# Patient Record
Sex: Female | Born: 1970 | Race: Black or African American | Hispanic: No | Smoking: Current every day smoker
Health system: Southern US, Community
[De-identification: ages and names within clinical notes are randomized; demographics above are authoritative.]

## PROBLEM LIST (undated history)

## (undated) ENCOUNTER — Inpatient Hospital Stay (HOSPITAL_COMMUNITY): Payer: Self-pay

## (undated) ENCOUNTER — Ambulatory Visit (HOSPITAL_COMMUNITY): Admission: EM | Source: Home / Self Care

## (undated) DIAGNOSIS — G43909 Migraine, unspecified, not intractable, without status migrainosus: Secondary | ICD-10-CM

## (undated) DIAGNOSIS — E669 Obesity, unspecified: Secondary | ICD-10-CM

---

## 2006-07-20 ENCOUNTER — Emergency Department (HOSPITAL_COMMUNITY): Admission: EM | Admit: 2006-07-20 | Discharge: 2006-07-20 | Payer: Self-pay | Admitting: Emergency Medicine

## 2006-12-27 ENCOUNTER — Emergency Department (HOSPITAL_COMMUNITY): Admission: EM | Admit: 2006-12-27 | Discharge: 2006-12-27 | Payer: Self-pay | Admitting: Emergency Medicine

## 2007-01-19 ENCOUNTER — Encounter: Admission: RE | Admit: 2007-01-19 | Discharge: 2007-02-08 | Payer: Self-pay | Admitting: Orthopedic Surgery

## 2007-03-22 ENCOUNTER — Emergency Department (HOSPITAL_COMMUNITY): Admission: EM | Admit: 2007-03-22 | Discharge: 2007-03-22 | Payer: Self-pay | Admitting: Emergency Medicine

## 2008-06-25 ENCOUNTER — Inpatient Hospital Stay (HOSPITAL_COMMUNITY): Admission: AD | Admit: 2008-06-25 | Discharge: 2008-06-25 | Payer: Self-pay | Admitting: Obstetrics & Gynecology

## 2008-06-25 ENCOUNTER — Encounter: Payer: Self-pay | Admitting: Obstetrics & Gynecology

## 2008-07-17 ENCOUNTER — Inpatient Hospital Stay (HOSPITAL_COMMUNITY): Admission: AD | Admit: 2008-07-17 | Discharge: 2008-07-17 | Payer: Self-pay | Admitting: Obstetrics and Gynecology

## 2008-07-17 ENCOUNTER — Encounter (INDEPENDENT_AMBULATORY_CARE_PROVIDER_SITE_OTHER): Payer: Self-pay | Admitting: Obstetrics and Gynecology

## 2008-10-14 ENCOUNTER — Inpatient Hospital Stay (HOSPITAL_COMMUNITY): Admission: AD | Admit: 2008-10-14 | Discharge: 2008-10-14 | Payer: Self-pay | Admitting: Obstetrics and Gynecology

## 2009-05-19 ENCOUNTER — Emergency Department (HOSPITAL_COMMUNITY): Admission: EM | Admit: 2009-05-19 | Discharge: 2009-05-19 | Payer: Self-pay | Admitting: Emergency Medicine

## 2009-05-28 ENCOUNTER — Inpatient Hospital Stay (HOSPITAL_COMMUNITY): Admission: AD | Admit: 2009-05-28 | Discharge: 2009-05-28 | Payer: Self-pay | Admitting: Obstetrics & Gynecology

## 2009-06-11 ENCOUNTER — Inpatient Hospital Stay (HOSPITAL_COMMUNITY): Admission: AD | Admit: 2009-06-11 | Discharge: 2009-06-11 | Payer: Self-pay | Admitting: Obstetrics and Gynecology

## 2009-08-02 ENCOUNTER — Ambulatory Visit (HOSPITAL_COMMUNITY): Admission: RE | Admit: 2009-08-02 | Discharge: 2009-08-02 | Payer: Self-pay | Admitting: Obstetrics & Gynecology

## 2009-10-09 ENCOUNTER — Inpatient Hospital Stay (HOSPITAL_COMMUNITY): Admission: AD | Admit: 2009-10-09 | Discharge: 2009-11-01 | Payer: Self-pay | Admitting: Obstetrics & Gynecology

## 2009-10-28 ENCOUNTER — Encounter (INDEPENDENT_AMBULATORY_CARE_PROVIDER_SITE_OTHER): Payer: Self-pay | Admitting: Obstetrics & Gynecology

## 2009-11-13 ENCOUNTER — Ambulatory Visit: Payer: Self-pay | Admitting: Obstetrics and Gynecology

## 2009-11-13 ENCOUNTER — Inpatient Hospital Stay (HOSPITAL_COMMUNITY): Admission: AD | Admit: 2009-11-13 | Discharge: 2009-11-13 | Payer: Self-pay | Admitting: Obstetrics & Gynecology

## 2010-03-27 ENCOUNTER — Observation Stay (HOSPITAL_COMMUNITY)
Admission: AD | Admit: 2010-03-27 | Discharge: 2010-03-28 | Disposition: A | Discharge status: Home / Self Care | Payer: Medicaid Other | Source: Ambulatory Visit | Attending: Obstetrics & Gynecology | Admitting: Obstetrics & Gynecology

## 2010-03-27 DIAGNOSIS — R112 Nausea with vomiting, unspecified: Secondary | ICD-10-CM | POA: Insufficient documentation

## 2010-03-27 DIAGNOSIS — K5289 Other specified noninfective gastroenteritis and colitis: Principal | ICD-10-CM | POA: Insufficient documentation

## 2010-03-27 LAB — CBC
HCT: 35.5 % — ABNORMAL LOW (ref 36.0–46.0)
Hemoglobin: 11.6 g/dL — ABNORMAL LOW (ref 12.0–15.0)
MCHC: 32.7 g/dL (ref 30.0–36.0)
MCV: 89.2 fL (ref 78.0–100.0)
RBC: 3.98 MIL/uL (ref 3.87–5.11)
RDW: 14.3 % (ref 11.5–15.5)

## 2010-03-27 LAB — COMPREHENSIVE METABOLIC PANEL
ALT: 26 U/L (ref 0–35)
AST: 23 U/L (ref 0–37)
Alkaline Phosphatase: 110 U/L (ref 39–117)
Creatinine, Ser: 0.65 mg/dL (ref 0.4–1.2)
GFR calc Af Amer: >60 mL/min (ref >60)
GFR calc non Af Amer: >60 mL/min (ref >60)
Glucose, Bld: 107 mg/dL — ABNORMAL HIGH (ref 70–99)
Potassium: 3.5 mEq/L (ref 3.5–5.1)
Total Bilirubin: 1 mg/dL (ref 0.3–1.2)
Total Protein: 8.3 g/dL (ref 6.0–8.3)

## 2010-03-28 LAB — CBC
MCH: 28.6 pg (ref 26.0–34.0)
MCHC: 32.4 g/dL (ref 30.0–36.0)
MCV: 88.3 fL (ref 78.0–100.0)
RDW: 14.5 % (ref 11.5–15.5)
WBC: 10.1 10*3/uL (ref 4.0–10.5)

## 2010-03-28 LAB — BASIC METABOLIC PANEL
Calcium: 8.6 mg/dL (ref 8.4–10.5)
Chloride: 105 mEq/L (ref 96–112)
GFR calc Af Amer: >60 mL/min (ref >60)
Potassium: 3.5 mEq/L (ref 3.5–5.1)
Sodium: 137 mEq/L (ref 135–145)

## 2010-03-28 LAB — AMYLASE: Amylase: 51 U/L (ref 0–105)

## 2010-04-23 NOTE — Discharge Summary (Addendum)
  NAME:  Madeline Silva, Madeline Silva.:  [id number]  MEDICAL RECORD NO.:  [id number]           PATIENT TYPE:  I  LOCATION:  9317                          FACILITY:  [facility]  PHYSICIAN:  Miguel Aschoff, M.D.       DATE OF BIRTH:  June 02, 1970  DATE OF ADMISSION:  03/27/2010 DATE OF DISCHARGE:  03/28/2010                              DISCHARGE SUMMARY   ADMISSION DIAGNOSES: 1. Nausea and vomiting. 2. Dehydration.  FINAL DIAGNOSES: 1. Nausea and vomiting. 2. Dehydration.  BRIEF HISTORY:  The patient is a 40 year old black female, who underwent elective termination of pregnancy on March 24, 2010 at the [facility].  The patient developed the onset of severe nausea and vomiting associated with low abdominal pain and vaginal bleeding, and was assessed on the day of admission at [facility], and felt to have retained products of conception.  A repeat suction and curettage procedure was carried out without difficulty, and the patient was observed in the recovery room, but continued to have persistent nausea and vomiting, and was able to tolerate even p.o. fluids.  Because of this, she was transferred to the [facility] to undergo IV fluid hydration and antiemetic therapy.  HOSPITAL COURSE:  Admission studies were obtained.  This revealed admission hemoglobin of 11.6, hematocrit of 35.5, white count was 12,700.  Basic metabolic profile revealed sodium 133, glucose 107, BUN was 4.  The remainder of the values were within normal limits.  The patient was started on IV fluids and antidiabetic treatment and responded readily and after an overnight observation, she was tolerating p.o. fluids well with resolution of both her pain, nausea and vomiting. By the morning of March 28, 2010, she was in satisfactory condition and felt to be stable enough to be discharged home.  Medications for home included: 1. Doxycycline 100 mg p.o. b.i.d. 2. Reglan 10 mg p.o.  q.6 h. p.r.n. nausea.  She was instructed to place nothing in the vagina for 2 weeks, call for any additional problems such as fever, pain, heavy bleeding, or persistent nausea and vomiting.  She will be seen back at the [facility] in 4 weeks for followup examination.  Her discharge hemoglobin was 9.0, white count was 10.1.  She was sent home in satisfactory condition.     Miguel Aschoff, M.D.     AR/MEDQ  D:  04/21/2010  T:  04/22/2010  Job:  [id number]  Electronically Signed by Miguel Aschoff M.D. on 04/23/2010 09:33:52 AM

## 2010-05-08 LAB — CBC
HCT: 34.5 % — ABNORMAL LOW (ref 36.0–46.0)
HCT: 31.8 % — ABNORMAL LOW (ref 36.0–46.0)
Hemoglobin: 11.4 g/dL — ABNORMAL LOW (ref 12.0–15.0)
Hemoglobin: 10.8 g/dL — ABNORMAL LOW (ref 12.0–15.0)
Hemoglobin: 9.9 g/dL — ABNORMAL LOW (ref 12.0–15.0)
MCH: 30.9 pg (ref 26.0–34.0)
MCH: 32 pg (ref 26.0–34.0)
MCHC: 34 g/dL (ref 30.0–36.0)
MCV: 93 fL (ref 78.0–100.0)
MCV: 93.9 fL (ref 78.0–100.0)
Platelets: 287 10*3/uL (ref 150–400)
Platelets: 163 10*3/uL (ref 150–400)
Platelets: 181 10*3/uL (ref 150–400)
RBC: 3.71 MIL/uL — ABNORMAL LOW (ref 3.87–5.11)
RBC: 3.1 MIL/uL — ABNORMAL LOW (ref 3.87–5.11)
RBC: 3.41 MIL/uL — ABNORMAL LOW (ref 3.87–5.11)
RDW: 13.9 % (ref 11.5–15.5)
WBC: 7 10*3/uL (ref 4.0–10.5)

## 2010-05-08 LAB — COMPREHENSIVE METABOLIC PANEL
ALT: 23 U/L (ref 0–35)
Albumin: 3.5 g/dL (ref 3.5–5.2)
BUN: 7 mg/dL (ref 6–23)
CO2: 29 mEq/L (ref 19–32)
Calcium: 9.3 mg/dL (ref 8.4–10.5)
Chloride: 108 mEq/L (ref 96–112)
Creatinine, Ser: 0.96 mg/dL (ref 0.4–1.2)
Glucose, Bld: 101 mg/dL — ABNORMAL HIGH (ref 70–99)
Sodium: 143 mEq/L (ref 135–145)
Total Bilirubin: 0.4 mg/dL (ref 0.3–1.2)

## 2010-05-09 LAB — CBC
HCT: 28.4 % — ABNORMAL LOW (ref 36.0–46.0)
Hemoglobin: 9.6 g/dL — ABNORMAL LOW (ref 12.0–15.0)
MCH: 31.5 pg (ref 26.0–34.0)
MCH: 31.7 pg (ref 26.0–34.0)
MCHC: 33.3 g/dL (ref 30.0–36.0)
MCV: 93.6 fL (ref 78.0–100.0)
Platelets: 171 10*3/uL (ref 150–400)
Platelets: 179 10*3/uL (ref 150–400)
RBC: 3.03 MIL/uL — ABNORMAL LOW (ref 3.87–5.11)
RBC: 3.33 MIL/uL — ABNORMAL LOW (ref 3.87–5.11)
RDW: 14.6 % (ref 11.5–15.5)
RDW: 14.7 % (ref 11.5–15.5)
WBC: 12 10*3/uL — ABNORMAL HIGH (ref 4.0–10.5)
WBC: 12.8 10*3/uL — ABNORMAL HIGH (ref 4.0–10.5)
WBC: 8.7 10*3/uL (ref 4.0–10.5)

## 2010-05-09 LAB — STREP B DNA PROBE: Strep Group B Ag: NEGATIVE

## 2010-05-14 LAB — URINALYSIS, ROUTINE W REFLEX MICROSCOPIC
Glucose, UA: NEGATIVE mg/dL
Leukocytes, UA: NEGATIVE
Nitrite: NEGATIVE
Specific Gravity, Urine: >1.03 — ABNORMAL HIGH (ref 1.005–1.030)
pH: 5.5 (ref 5.0–8.0)

## 2010-05-14 LAB — URINE MICROSCOPIC-ADD ON

## 2010-05-14 LAB — WET PREP, GENITAL: Yeast Wet Prep HPF POC: NONE SEEN

## 2010-05-19 LAB — HCG, QUANTITATIVE, PREGNANCY: hCG, Beta Chain, Quant, S: 44703 m[IU]/mL — ABNORMAL HIGH (ref <5)

## 2010-05-19 LAB — URINALYSIS, ROUTINE W REFLEX MICROSCOPIC
Glucose, UA: NEGATIVE mg/dL
Ketones, ur: >80 mg/dL — AB
Nitrite: NEGATIVE
Specific Gravity, Urine: 1.02 (ref 1.005–1.030)
pH: 7.5 (ref 5.0–8.0)

## 2010-05-19 LAB — URINE MICROSCOPIC-ADD ON

## 2010-05-29 IMAGING — US US OB COMP LESS 14 WK
1 series · 14 of 28 positions shown · non-contrast
Comparison: none

OBSTETRICAL ULTRASOUND:
 This ultrasound exam was performed in the [HOSPITAL] Ultrasound Department.  The OB US report was generated in the AS system, and faxed to the ordering physician.  This report is also available in [HOSPITAL]?s AccessANYware and in [REDACTED] PACS.

[Series 1: us ob comp less 14 wks · 0.30mm/px · 14 of 45 slices shown]
[im 2/45]
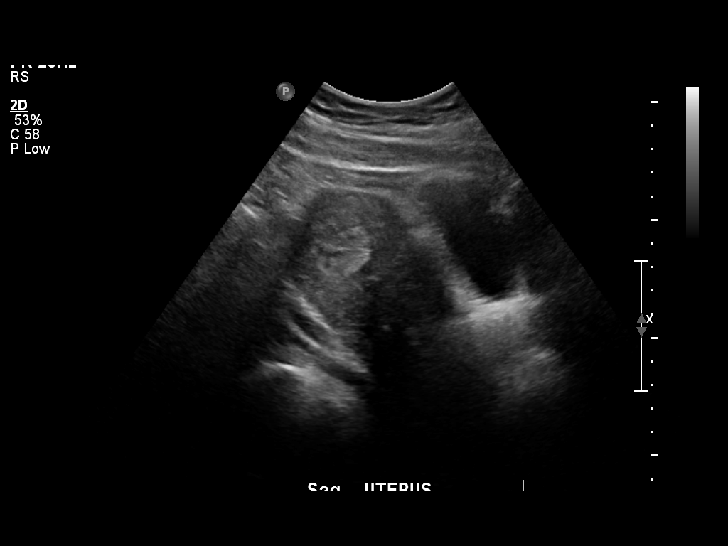
[im 5/45]
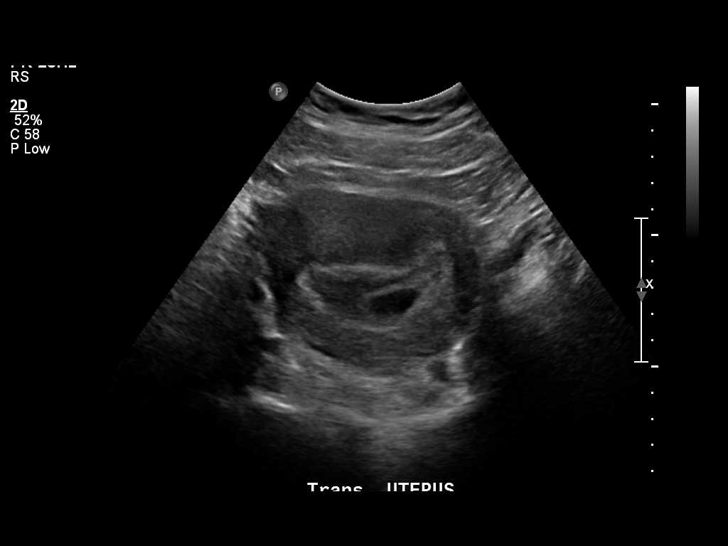
[im 9/45]
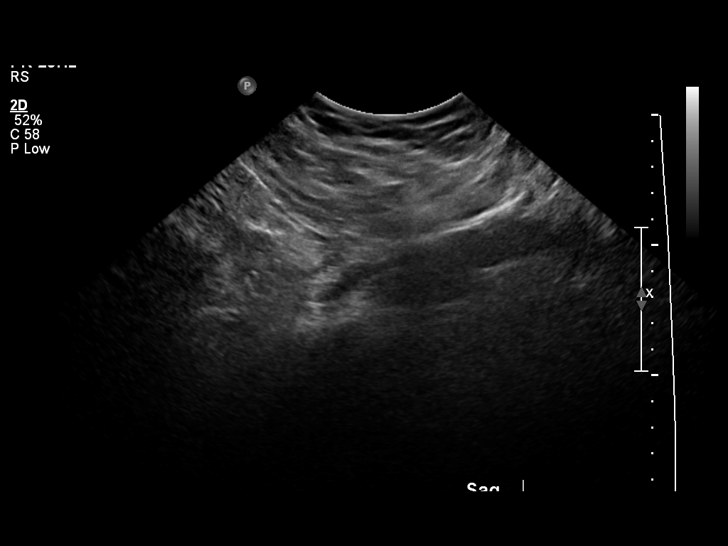
[im 12/45]
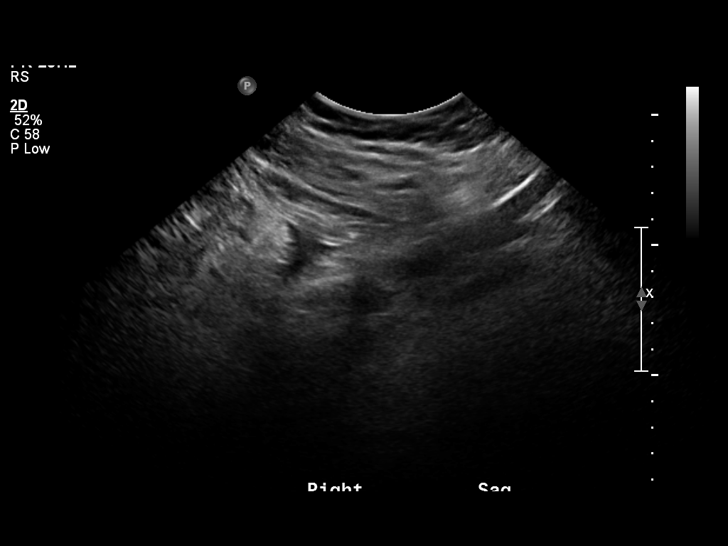
[im 15/45]
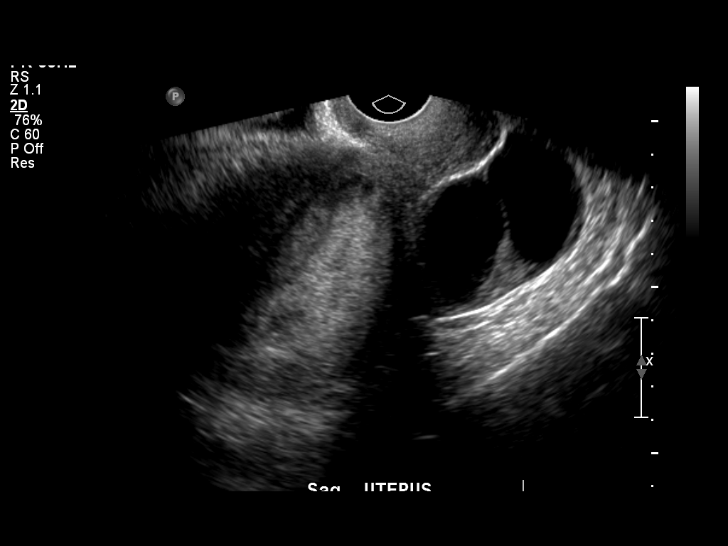
[im 18/45]
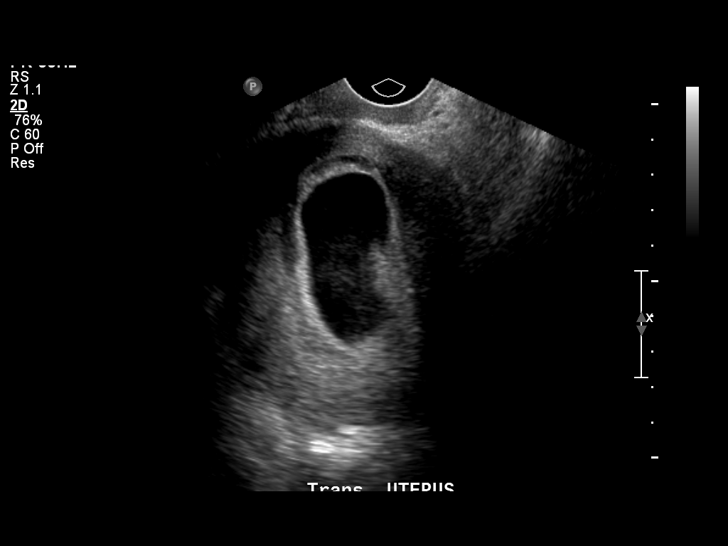
[im 22/45]
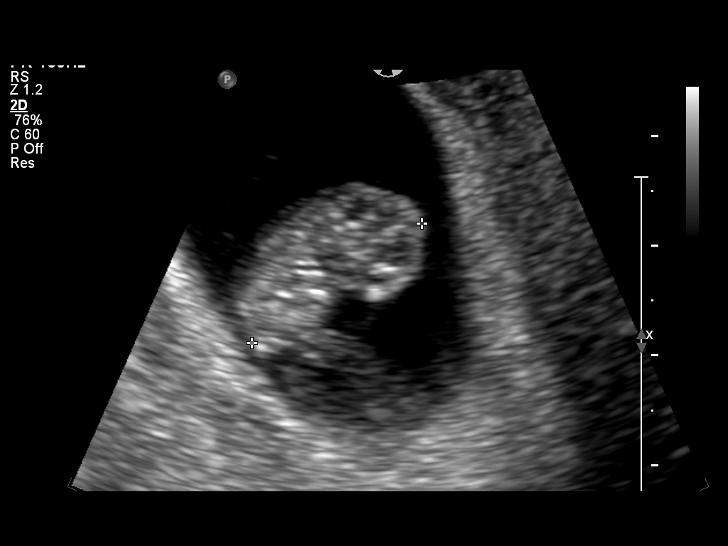
[im 25/45]
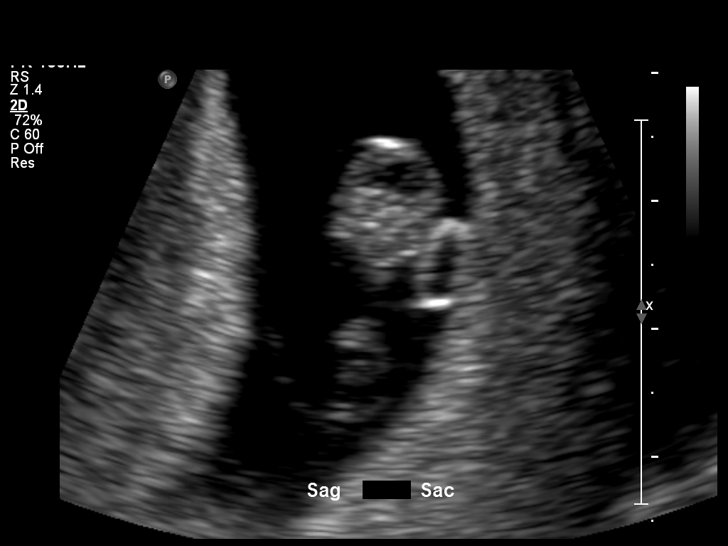
[im 28/45]
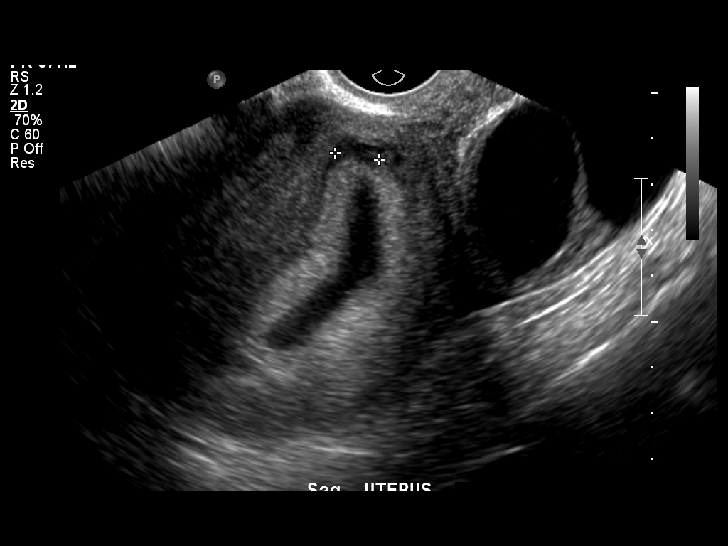
[im 31/45]
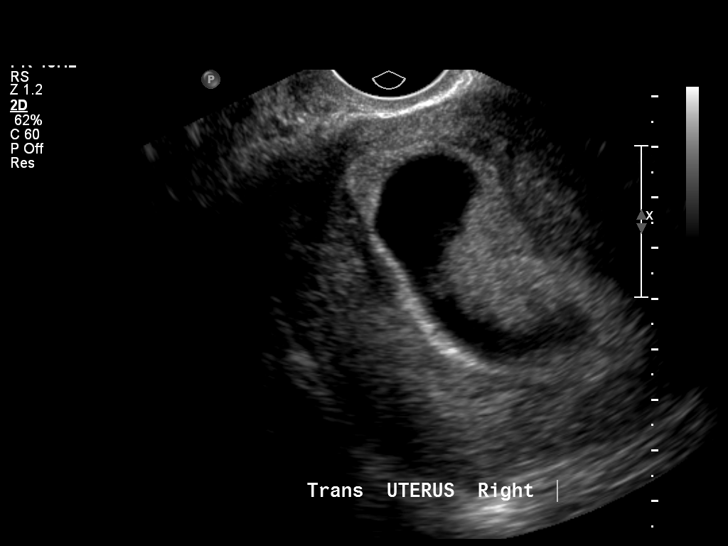
[im 35/45]
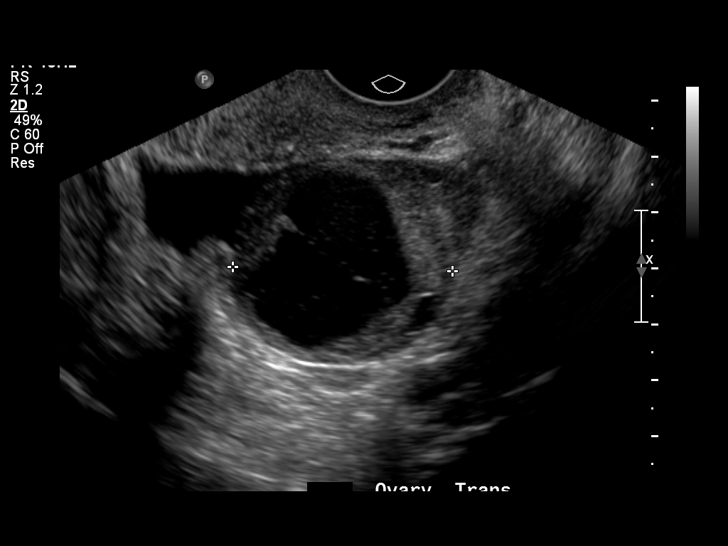
[im 38/45]
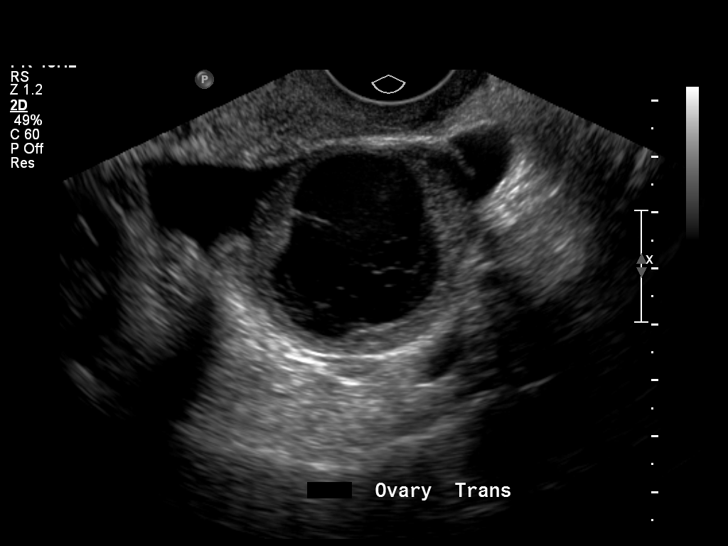
[im 41/45]
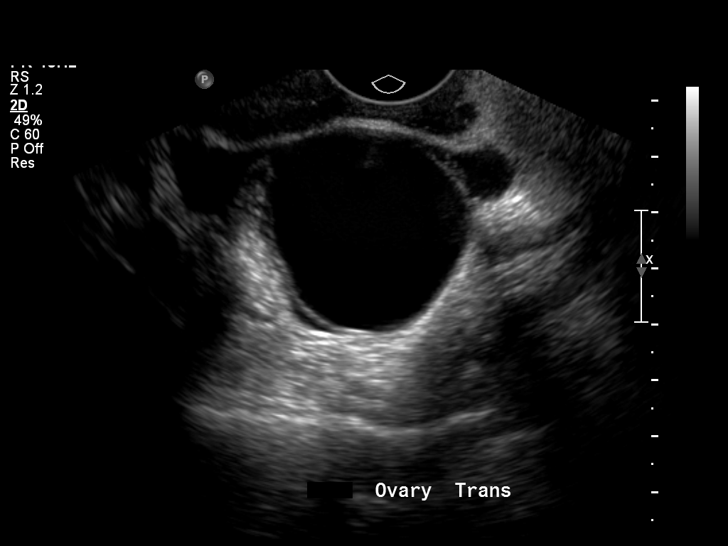
[im 45/45]
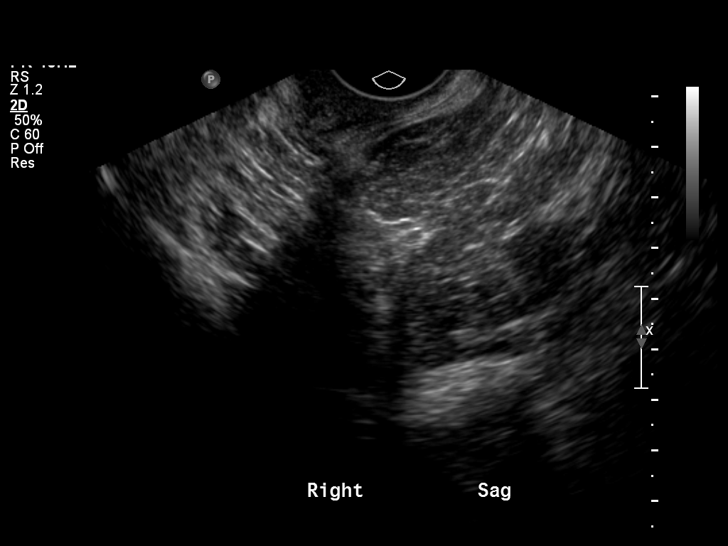

[14 of 28 positions shown; findings below may reference images not displayed]

IMPRESSION: See AS Obstetric US report.

## 2010-06-03 LAB — URINALYSIS, ROUTINE W REFLEX MICROSCOPIC
Ketones, ur: 15 mg/dL — AB
Specific Gravity, Urine: >1.03 — ABNORMAL HIGH (ref 1.005–1.030)
pH: 6 (ref 5.0–8.0)

## 2010-06-03 LAB — CBC
HCT: 36.8 % (ref 36.0–46.0)
HCT: 38.9 % (ref 36.0–46.0)
Hemoglobin: 12.5 g/dL (ref 12.0–15.0)
Hemoglobin: 13 g/dL (ref 12.0–15.0)
MCHC: 33.3 g/dL (ref 30.0–36.0)
MCV: 92 fL (ref 78.0–100.0)
MCV: 91.8 fL (ref 78.0–100.0)
RDW: 14.6 % (ref 11.5–15.5)
RDW: 14.6 % (ref 11.5–15.5)
WBC: 9.8 10*3/uL (ref 4.0–10.5)

## 2010-06-03 LAB — WET PREP, GENITAL: Yeast Wet Prep HPF POC: NONE SEEN

## 2010-06-03 LAB — GC/CHLAMYDIA PROBE AMP, GENITAL
Chlamydia, DNA Probe: NEGATIVE
GC Probe Amp, Genital: NEGATIVE

## 2010-06-03 LAB — URINE MICROSCOPIC-ADD ON

## 2010-10-12 ENCOUNTER — Emergency Department (HOSPITAL_COMMUNITY): Payer: Self-pay

## 2010-10-12 ENCOUNTER — Emergency Department (HOSPITAL_COMMUNITY)
Admission: EM | Admit: 2010-10-12 | Discharge: 2010-10-12 | Disposition: A | Discharge status: Home / Self Care | Payer: Self-pay | Attending: Emergency Medicine | Admitting: Emergency Medicine

## 2010-10-12 DIAGNOSIS — M7989 Other specified soft tissue disorders: Secondary | ICD-10-CM | POA: Insufficient documentation

## 2010-10-12 DIAGNOSIS — W1809XA Striking against other object with subsequent fall, initial encounter: Secondary | ICD-10-CM | POA: Insufficient documentation

## 2010-10-12 DIAGNOSIS — S6990XA Unspecified injury of unspecified wrist, hand and finger(s), initial encounter: Secondary | ICD-10-CM | POA: Insufficient documentation

## 2010-10-12 DIAGNOSIS — S6980XA Other specified injuries of unspecified wrist, hand and finger(s), initial encounter: Secondary | ICD-10-CM | POA: Insufficient documentation

## 2010-10-23 ENCOUNTER — Emergency Department (HOSPITAL_COMMUNITY): Payer: Self-pay

## 2010-10-23 ENCOUNTER — Emergency Department (HOSPITAL_COMMUNITY)
Admission: EM | Admit: 2010-10-23 | Discharge: 2010-10-24 | Disposition: A | Discharge status: Home / Self Care | Payer: Self-pay | Attending: Emergency Medicine | Admitting: Emergency Medicine

## 2010-10-23 DIAGNOSIS — J189 Pneumonia, unspecified organism: Secondary | ICD-10-CM | POA: Insufficient documentation

## 2010-10-23 DIAGNOSIS — R0602 Shortness of breath: Secondary | ICD-10-CM | POA: Insufficient documentation

## 2011-06-15 ENCOUNTER — Encounter (HOSPITAL_COMMUNITY): Payer: Self-pay | Admitting: *Deleted

## 2011-06-15 ENCOUNTER — Inpatient Hospital Stay (HOSPITAL_COMMUNITY)
Admission: AD | Admit: 2011-06-15 | Discharge: 2011-06-15 | Disposition: A | Discharge status: Home / Self Care | Payer: Self-pay | Source: Ambulatory Visit | Attending: Obstetrics and Gynecology | Admitting: Obstetrics and Gynecology

## 2011-06-15 DIAGNOSIS — A5901 Trichomonal vulvovaginitis: Secondary | ICD-10-CM | POA: Insufficient documentation

## 2011-06-15 DIAGNOSIS — A599 Trichomoniasis, unspecified: Secondary | ICD-10-CM

## 2011-06-15 DIAGNOSIS — L293 Anogenital pruritus, unspecified: Secondary | ICD-10-CM | POA: Insufficient documentation

## 2011-06-15 LAB — URINALYSIS, ROUTINE W REFLEX MICROSCOPIC
Glucose, UA: NEGATIVE mg/dL
Ketones, ur: NEGATIVE mg/dL
Nitrite: NEGATIVE
Protein, ur: NEGATIVE mg/dL

## 2011-06-15 LAB — URINE MICROSCOPIC-ADD ON

## 2011-06-15 LAB — WET PREP, GENITAL

## 2011-06-15 MED ORDER — METRONIDAZOLE 500 MG PO TABS: 500.0000 mg | ORAL_TABLET | Freq: Two times a day (BID) | ORAL | Status: AC

## 2011-06-15 NOTE — Discharge Instructions (Signed)
Trichomoniasis Trichomoniasis is an infection, caused by the Trichomonas organism, that affects both women and men. In women, the outer female genitalia and the vagina are affected. In men, the penis is mainly affected, but the prostate and other reproductive organs can also be involved. Trichomoniasis is a sexually transmitted disease (STD) and is most often passed to another person through sexual contact. The majority of people who get trichomoniasis do so from a sexual encounter and are also at risk for other STDs. CAUSES   Sexual intercourse with an infected partner.   It can be present in swimming pools or hot tubs.  SYMPTOMS   Abnormal gray-green frothy vaginal discharge in women.   Vaginal itching and irritation in women.   Itching and irritation of the area outside the vagina in women.   Penile discharge with or without pain in males.   Inflammation of the urethra (urethritis), causing painful urination.   Bleeding after sexual intercourse.  RELATED COMPLICATIONS  Pelvic inflammatory disease.   Infection of the uterus (endometritis).   Infertility.   Tubal (ectopic) pregnancy.   It can be associated with other STDs, including gonorrhea and chlamydia, hepatitis B, and HIV.  COMPLICATIONS DURING PREGNANCY  Early (premature) delivery.   Premature rupture of the membranes (PROM).   Low birth weight.  DIAGNOSIS   Visualization of Trichomonas under the microscope from the vagina discharge.   Ph of the vagina greater than 4.5, tested with a test tape.   Trich Rapid Test.   Culture of the organism, but this is not usually needed.   It may be found on a Pap test.   Having a "strawberry cervix,"which means the cervix looks very red like a strawberry.  TREATMENT   You may be given medication to fight the infection. Inform your caregiver if you could be or are pregnant. Some medications used to treat the infection should not be taken during pregnancy.    Over-the-counter medications or creams to decrease itching or irritation may be recommended.   Your sexual partner will need to be treated if infected.  HOME CARE INSTRUCTIONS   Take all medication prescribed by your caregiver.   Take over-the-counter medication for itching or irritation as directed by your caregiver.   Do not have sexual intercourse while you have the infection.   Do not douche or wear tampons.   Discuss your infection with your partner, as your partner may have acquired the infection from you. Or, your partner may have been the person who transmitted the infection to you.   Have your sex partner examined and treated if necessary.   Practice safe, informed, and protected sex.   See your caregiver for other STD testing.  SEEK MEDICAL CARE IF:   You still have symptoms after you finish the medication.   You have an oral temperature above 102 F (38.9 C).   You develop belly (abdominal) pain.   You have pain when you urinate.   You have bleeding after sexual intercourse.   You develop a rash.   The medication makes you sick or makes you throw up (vomit).  Document Released: 08/05/2000 Document Revised: 01/29/2011 Document Reviewed: 08/31/2008 [facility] Patient Information 2012 [place], [place].  Prenatal Care [facility]    [facility]  [phone]     Phone: [phone]          [facility]  [facility]  @[place]     Phone: [phone]  Phone: [phone]         [facility]     Phone: [phone]  Phone: [phone]           [facility] @ [place]                hone: [phone]  Phone: [phone]         [facility] [address]      Phone: [phone]  Phone: [phone]         [facility] Dept[facility]                 Phone: [phone]  [facility]   Phone:[phone]    [facility] [address] [place])          Phone: [phone] [facility]   Phone: [phone]  These providers, as well as [facility], also provide birth control

## 2011-06-15 NOTE — MAU Provider Note (Signed)
Meha [id number] y.o. No chief complaint on file.    None     SUBJECTIVE  HPI:  Madeline Silva is a 41 y.o. year old [id number] who presents to MAU reporting vulvovaginal itching and discharge x several days. Used Monistat externally without relief. Hx yeast x 1. No contraception and only recent sexual activity was 2 wks ago. LMP 1 wk ago.   History reviewed. No pertinent past medical history. No past surgical history on file. History   Social History  . Marital Status: Single    Spouse Name: N/A    Number of Children: N/A  . Years of Education: N/A   Occupational History  . Not on file.   Social History Main Topics  . Smoking status: Current Everyday Smoker -- 0.2 packs/day    Types: Cigarettes  . Smokeless tobacco: Never Used  . Alcohol Use: Yes     weekends  . Drug Use: No  . Sexually Active:    Other Topics Concern  . Not on file   Social History Narrative  . No narrative on file   No current facility-administered medications on file prior to encounter.   No current outpatient prescriptions on file prior to encounter.   No Known Allergies  ROS: Pertinent items in HPI  OBJECTIVE Height 5\' 4"  (1.626 m), weight 125.193 kg (276 lb). GENERAL: Well-developed, well-nourished female in no acute distress.  ABDOMEN: Soft, nontender EXTREMITIES: Nontender, no edema SPECULUM EXAM: NEFG, sl red,  Moderate grey white discharge, no blood noted, cervix nulliparous, sl friable BIMANUAL: cervix w/o CMT; uterus NSSP; no adnexal tenderness or masses   LAB RESULTS Results for orders placed during the hospital encounter of 06/15/11 (from the past 24 hour(s))  POCT PREGNANCY, URINE     Status: Normal   Collection Time   06/15/11  8:34 AM      Component Value Range   Preg Test, Ur NEGATIVE  NEGATIVE   URINALYSIS, ROUTINE W REFLEX MICROSCOPIC     Status: Abnormal   Collection Time   06/15/11  8:37 AM      Component Value Range   Color, Urine YELLOW  YELLOW    APPearance CLEAR   CLEAR    Specific Gravity, Urine >1.030 (*) 1.005 - 1.030    pH 6.0  5.0 - 8.0    Glucose, UA NEGATIVE  NEGATIVE (mg/dL)   Hgb urine dipstick TRACE (*) NEGATIVE    Bilirubin Urine NEGATIVE  NEGATIVE    Ketones, ur NEGATIVE  NEGATIVE (mg/dL)   Protein, ur NEGATIVE  NEGATIVE (mg/dL)   Urobilinogen, UA 0.2  0.0 - 1.0 (mg/dL)   Nitrite NEGATIVE  NEGATIVE    Leukocytes, UA SMALL (*) NEGATIVE   URINE MICROSCOPIC-ADD ON     Status: Abnormal   Collection Time   06/15/11  8:37 AM      Component Value Range   Squamous Epithelial / LPF RARE  RARE    WBC, UA 3-6  <3 (WBC/hpf)   RBC / HPF 0-2  <3 (RBC/hpf)   Bacteria, UA FEW (*) RARE    Urine-Other MUCOUS PRESENT    WET PREP, GENITAL     Status: Abnormal   Collection Time   06/15/11  8:45 AM      Component Value Range   Yeast Wet Prep HPF POC NONE SEEN  NONE SEEN    Trich, Wet Prep MODERATE (*) NONE SEEN    Clue Cells Wet Prep HPF POC FEW (*) NONE SEEN    WBC,  Wet Prep HPF POC FEW (*) NONE SEEN    GC/CT sent    ASSESSMENT Trichomonas vulvovaginitis  PLAN  Rx Flagyl Information on trich, STIs, safe sex    Madeline Silva 06/15/2011 8:43 AM

## 2011-06-15 NOTE — MAU Provider Note (Signed)
Agree with above note.  Kajal Scalici 06/15/2011 9:32 AM   

## 2011-06-16 LAB — GC/CHLAMYDIA PROBE AMP, GENITAL
Chlamydia, DNA Probe: NEGATIVE
GC Probe Amp, Genital: NEGATIVE

## 2011-09-28 ENCOUNTER — Emergency Department (HOSPITAL_COMMUNITY)
Admission: EM | Admit: 2011-09-28 | Discharge: 2011-09-28 | Discharge status: Against Medical Advice | Payer: Medicaid Other | Attending: Emergency Medicine | Admitting: Emergency Medicine

## 2011-09-28 ENCOUNTER — Encounter (HOSPITAL_COMMUNITY): Payer: Self-pay | Admitting: *Deleted

## 2011-09-28 DIAGNOSIS — R51 Headache: Secondary | ICD-10-CM | POA: Insufficient documentation

## 2011-09-28 DIAGNOSIS — R110 Nausea: Secondary | ICD-10-CM | POA: Insufficient documentation

## 2011-09-28 LAB — BASIC METABOLIC PANEL
BUN: 9 mg/dL (ref 6–23)
CO2: 23 mEq/L (ref 19–32)
Chloride: 104 mEq/L (ref 96–112)
Glucose, Bld: 93 mg/dL (ref 70–99)
Potassium: 3.9 mEq/L (ref 3.5–5.1)

## 2011-09-28 LAB — CBC WITH DIFFERENTIAL/PLATELET
Eosinophils Absolute: 0.1 10*3/uL (ref 0.0–0.7)
Hemoglobin: 13.6 g/dL (ref 12.0–15.0)
Lymphs Abs: 2 10*3/uL (ref 0.7–4.0)
MCH: 30.4 pg (ref 26.0–34.0)
Monocytes Relative: 7 % (ref 3–12)
Neutrophils Relative %: 64 % (ref 43–77)
RBC: 4.48 MIL/uL (ref 3.87–5.11)

## 2011-09-28 NOTE — ED Notes (Signed)
Pt called in waiting room but no answer 

## 2011-09-28 NOTE — ED Provider Notes (Signed)
Pt not in room for evaluation.   Fayrene Helper, PA-C 09/28/11 1452

## 2011-09-28 NOTE — ED Notes (Signed)
Phlebotomy reports to RN that pt mentioned while her blood was being drawn that she wanted her blood tested to see how far along she was in pregnancy because she believes she is pregnant. Urine preg added to orders.

## 2011-09-28 NOTE — ED Notes (Signed)
Pt reports nausea x2 weeks. When questioned about vomiting, she reports she has "spit up" approx 4x. Only nausea in last 24 hours. C/o HA, feels like it is developing into a migraine. Hx of same. +Photophobia. Denies vision changes.

## 2011-09-30 ENCOUNTER — Inpatient Hospital Stay (HOSPITAL_COMMUNITY): Payer: Medicaid Other

## 2011-09-30 ENCOUNTER — Inpatient Hospital Stay (HOSPITAL_COMMUNITY)
Admission: AD | Admit: 2011-09-30 | Discharge: 2011-09-30 | Disposition: A | Discharge status: Home / Self Care | Payer: Medicaid Other | Source: Ambulatory Visit | Attending: Obstetrics and Gynecology | Admitting: Obstetrics and Gynecology

## 2011-09-30 ENCOUNTER — Encounter (HOSPITAL_COMMUNITY): Payer: Self-pay | Admitting: *Deleted

## 2011-09-30 DIAGNOSIS — O219 Vomiting of pregnancy, unspecified: Secondary | ICD-10-CM

## 2011-09-30 DIAGNOSIS — O99891 Other specified diseases and conditions complicating pregnancy: Secondary | ICD-10-CM | POA: Insufficient documentation

## 2011-09-30 DIAGNOSIS — O26899 Other specified pregnancy related conditions, unspecified trimester: Secondary | ICD-10-CM

## 2011-09-30 DIAGNOSIS — R109 Unspecified abdominal pain: Secondary | ICD-10-CM | POA: Insufficient documentation

## 2011-09-30 LAB — WET PREP, GENITAL: Yeast Wet Prep HPF POC: NONE SEEN

## 2011-09-30 LAB — CBC
HCT: 37.2 % (ref 36.0–46.0)
Hemoglobin: 12.2 g/dL (ref 12.0–15.0)
WBC: 6.3 10*3/uL (ref 4.0–10.5)

## 2011-09-30 LAB — URINE MICROSCOPIC-ADD ON

## 2011-09-30 LAB — URINALYSIS, ROUTINE W REFLEX MICROSCOPIC
Glucose, UA: NEGATIVE mg/dL
Specific Gravity, Urine: >1.03 — ABNORMAL HIGH (ref 1.005–1.030)
pH: 6 (ref 5.0–8.0)

## 2011-09-30 MED ORDER — PROMETHAZINE HCL 25 MG RE SUPP: 25.0000 mg | Freq: Four times a day (QID) | RECTAL | Status: DC | PRN

## 2011-09-30 NOTE — MAU Note (Addendum)
Pt presents with pressure for over one week and nausea for 2 weeks. LMP was back sometime in June and pt had a + pregnancy test at home 2 days ago

## 2011-09-30 NOTE — MAU Provider Note (Signed)
History     CSN: [id number]  Arrival date and time: 09/30/11 0955   None     No chief complaint on file.  HPI  Pt is [redacted]w[redacted]d pregnant by LMP 08/15/2011 and complains of lower abdominal pain that comes and goes; she has increase in abdominal pressure in the morning and afternoons.  Pt denies spotting, bleeding, constipation, diarrhea, fever, chills or UTI symptoms.  She has a hx of SAB at 7 weeks and a preterm delivery at 27 weeks with PROM.  No past medical history on file.  Past Surgical History  Procedure Date  . Cesarean section     No family history on file.  History  Substance Use Topics  . Smoking status: Current Everyday Smoker -- 0.2 packs/day    Types: Cigarettes  . Smokeless tobacco: Never Used  . Alcohol Use: No    Allergies: No Known Allergies  No prescriptions prior to admission    ROS Physical Exam   Blood pressure 124/71, pulse 78, temperature 98.1 F (36.7 C), temperature source Oral, resp. rate 18, height 5' 3.5" (1.613 m), weight 127.733 kg (281 lb 9.6 oz), last menstrual period 08/15/2011, unknown if currently breastfeeding.  Physical Exam  MAU Course  Procedures Results for orders placed during the hospital encounter of 09/30/11 (from the past 24 hour(s))  PREGNANCY, URINE     Status: Abnormal   Collection Time   09/30/11 10:13 AM      Component Value Range   Preg Test, Ur POSITIVE (*) NEGATIVE  URINALYSIS, ROUTINE W REFLEX MICROSCOPIC     Status: Abnormal   Collection Time   09/30/11 10:13 AM      Component Value Range   Color, Urine BROWN (*) YELLOW   APPearance CLEAR  CLEAR   Specific Gravity, Urine >1.030 (*) 1.005 - 1.030   pH 6.0  5.0 - 8.0   Glucose, UA NEGATIVE  NEGATIVE mg/dL   Hgb urine dipstick NEGATIVE  NEGATIVE   Bilirubin Urine NEGATIVE  NEGATIVE   Ketones, ur NEGATIVE  NEGATIVE mg/dL   Protein, ur NEGATIVE  NEGATIVE mg/dL   Urobilinogen, UA 0.2  0.0 - 1.0 mg/dL   Nitrite NEGATIVE  NEGATIVE   Leukocytes, UA TRACE (*)  NEGATIVE  URINE MICROSCOPIC-ADD ON     Status: Abnormal   Collection Time   09/30/11 10:13 AM      Component Value Range   Squamous Epithelial / LPF MANY (*) RARE   WBC, UA 7-10  <3 WBC/hpf   Bacteria, UA FEW (*) RARE   Urine-Other MUCOUS PRESENT    POCT PREGNANCY, URINE     Status: Abnormal   Collection Time   09/30/11 10:26 AM      Component Value Range   Preg Test, Ur POSITIVE (*) NEGATIVE  CBC     Status: Normal   Collection Time   09/30/11 11:05 AM      Component Value Range   WBC 6.3  4.0 - 10.5 K/uL   RBC 4.10  3.87 - 5.11 MIL/uL   Hemoglobin 12.2  12.0 - 15.0 g/dL   HCT 40.9  81.1 - 91.4 %   MCV 90.7  78.0 - 100.0 fL   MCH 29.8  26.0 - 34.0 pg   MCHC 32.8  30.0 - 36.0 g/dL   RDW 78.2  95.6 - 21.3 %   Platelets 192  150 - 400 K/uL  HCG, QUANTITATIVE, PREGNANCY     Status: Abnormal   Collection Time   09/30/11  11:05 AM      Component Value Range   hCG, Beta Francene Finders 20632 (*) <5 mIU/mL   Results for orders placed during the hospital encounter of 09/30/11 (from the past 24 hour(s))  PREGNANCY, URINE     Status: Abnormal   Collection Time   09/30/11 10:13 AM      Component Value Range   Preg Test, Ur POSITIVE (*) NEGATIVE  URINALYSIS, ROUTINE W REFLEX MICROSCOPIC     Status: Abnormal   Collection Time   09/30/11 10:13 AM      Component Value Range   Color, Urine BROWN (*) YELLOW   APPearance CLEAR  CLEAR   Specific Gravity, Urine >1.030 (*) 1.005 - 1.030   pH 6.0  5.0 - 8.0   Glucose, UA NEGATIVE  NEGATIVE mg/dL   Hgb urine dipstick NEGATIVE  NEGATIVE   Bilirubin Urine NEGATIVE  NEGATIVE   Ketones, ur NEGATIVE  NEGATIVE mg/dL   Protein, ur NEGATIVE  NEGATIVE mg/dL   Urobilinogen, UA 0.2  0.0 - 1.0 mg/dL   Nitrite NEGATIVE  NEGATIVE   Leukocytes, UA TRACE (*) NEGATIVE  URINE MICROSCOPIC-ADD ON     Status: Abnormal   Collection Time   09/30/11 10:13 AM      Component Value Range   Squamous Epithelial / LPF MANY (*) RARE   WBC, UA 7-10  <3 WBC/hpf   Bacteria, UA  FEW (*) RARE   Urine-Other MUCOUS PRESENT    POCT PREGNANCY, URINE     Status: Abnormal   Collection Time   09/30/11 10:26 AM      Component Value Range   Preg Test, Ur POSITIVE (*) NEGATIVE  CBC     Status: Normal   Collection Time   09/30/11 11:05 AM      Component Value Range   WBC 6.3  4.0 - 10.5 K/uL   RBC 4.10  3.87 - 5.11 MIL/uL   Hemoglobin 12.2  12.0 - 15.0 g/dL   HCT 16.1  09.6 - 04.5 %   MCV 90.7  78.0 - 100.0 fL   MCH 29.8  26.0 - 34.0 pg   MCHC 32.8  30.0 - 36.0 g/dL   RDW 40.9  81.1 - 91.4 %   Platelets 192  150 - 400 K/uL  HCG, QUANTITATIVE, PREGNANCY     Status: Abnormal   Collection Time   09/30/11 11:05 AM      Component Value Range   hCG, Beta Chain, Mahalia Longest 20632 (*) <5 mIU/mL  WET PREP, GENITAL     Status: Normal   Collection Time   09/30/11  1:15 PM      Component Value Range   Yeast Wet Prep HPF POC NONE SEEN  NONE SEEN   Trich, Wet Prep NONE SEEN  NONE SEEN   Clue Cells Wet Prep HPF POC NONE SEEN  NONE SEEN   WBC, Wet Prep HPF POC NONE SEEN  NONE SEEN    Assessment and Plan  Single living IUP Proceed with prenatal care Phenergan suppository 25mg  for nausea/comiting  Mychael Smock 09/30/2011, 10:58 AM

## 2011-09-30 NOTE — ED Provider Notes (Signed)
Medical screening examination/treatment/procedure(s) were performed by non-physician practitioner and as supervising physician I was immediately available for consultation/collaboration.  Shelda Jakes, MD 09/30/11 [phone]

## 2011-10-01 NOTE — MAU Provider Note (Signed)
Agree with above note.  Madeline Silva 10/01/2011 10:38 AM

## 2011-11-10 ENCOUNTER — Other Ambulatory Visit: Payer: Self-pay

## 2011-11-10 LAB — OB RESULTS CONSOLE HIV ANTIBODY (ROUTINE TESTING): HIV: NONREACTIVE

## 2011-11-10 LAB — OB RESULTS CONSOLE RPR: RPR: NONREACTIVE

## 2011-11-10 LAB — OB RESULTS CONSOLE ANTIBODY SCREEN: Antibody Screen: NEGATIVE

## 2011-11-10 LAB — OB RESULTS CONSOLE HEPATITIS B SURFACE ANTIGEN: Hepatitis B Surface Ag: NEGATIVE

## 2011-11-10 LAB — OB RESULTS CONSOLE ABO/RH

## 2011-12-31 ENCOUNTER — Other Ambulatory Visit (HOSPITAL_COMMUNITY): Payer: Self-pay | Admitting: Obstetrics & Gynecology

## 2011-12-31 ENCOUNTER — Other Ambulatory Visit (HOSPITAL_COMMUNITY): Payer: Self-pay | Admitting: Gynecology

## 2011-12-31 ENCOUNTER — Ambulatory Visit (HOSPITAL_COMMUNITY)
Admission: RE | Admit: 2011-12-31 | Discharge: 2011-12-31 | Disposition: A | Discharge status: Home / Self Care | Payer: Medicaid Other | Source: Ambulatory Visit | Attending: Obstetrics & Gynecology | Admitting: Obstetrics & Gynecology

## 2011-12-31 ENCOUNTER — Encounter (HOSPITAL_COMMUNITY): Payer: Self-pay

## 2011-12-31 DIAGNOSIS — O99210 Obesity complicating pregnancy, unspecified trimester: Secondary | ICD-10-CM | POA: Insufficient documentation

## 2011-12-31 DIAGNOSIS — O09529 Supervision of elderly multigravida, unspecified trimester: Secondary | ICD-10-CM

## 2011-12-31 DIAGNOSIS — Z3689 Encounter for other specified antenatal screening: Secondary | ICD-10-CM

## 2011-12-31 DIAGNOSIS — O358XX0 Maternal care for other (suspected) fetal abnormality and damage, not applicable or unspecified: Secondary | ICD-10-CM | POA: Insufficient documentation

## 2011-12-31 DIAGNOSIS — Z363 Encounter for antenatal screening for malformations: Secondary | ICD-10-CM | POA: Insufficient documentation

## 2011-12-31 DIAGNOSIS — Z1389 Encounter for screening for other disorder: Secondary | ICD-10-CM | POA: Insufficient documentation

## 2011-12-31 DIAGNOSIS — E669 Obesity, unspecified: Secondary | ICD-10-CM | POA: Insufficient documentation

## 2011-12-31 DIAGNOSIS — Z8751 Personal history of pre-term labor: Secondary | ICD-10-CM | POA: Insufficient documentation

## 2011-12-31 NOTE — Progress Notes (Signed)
Madeline Silva  was seen today for an ultrasound appointment.  See full report in [facility].  Alpha Gula, MD  Single IUP at 19 3/7 weeks Normal detailed fetal anatomy; however, somewhat limited due to materal body habitus No markers associated with Down syndrome were noted Normal amniotic fluid volume  Recommend follow up ultrasounds as clinically indicated

## 2012-05-02 ENCOUNTER — Encounter (HOSPITAL_COMMUNITY): Payer: Self-pay

## 2012-05-12 ENCOUNTER — Encounter (HOSPITAL_COMMUNITY): Payer: Self-pay

## 2012-05-13 ENCOUNTER — Encounter (HOSPITAL_COMMUNITY): Payer: Self-pay

## 2012-05-13 ENCOUNTER — Encounter (HOSPITAL_COMMUNITY)
Admission: RE | Admit: 2012-05-13 | Discharge: 2012-05-13 | Disposition: A | Discharge status: Home / Self Care | Payer: Medicaid Other | Source: Ambulatory Visit | Attending: Obstetrics & Gynecology | Admitting: Obstetrics & Gynecology

## 2012-05-13 LAB — TYPE AND SCREEN
ABO/RH(D): O POS
Antibody Screen: POSITIVE
DAT, IgG: NEGATIVE

## 2012-05-13 LAB — CBC
HCT: 34.1 % — ABNORMAL LOW (ref 36.0–46.0)
MCH: 30.1 pg (ref 26.0–34.0)
MCHC: 33.1 g/dL (ref 30.0–36.0)
RDW: 14.2 % (ref 11.5–15.5)

## 2012-05-13 LAB — RPR: RPR Ser Ql: NONREACTIVE

## 2012-05-13 NOTE — Patient Instructions (Addendum)
20 Madeline Silva  05/13/2012   Your procedure is scheduled on:  05/17/12  Enter through the Main Entrance of [facility] at 1100 AM.  Pick up the phone at the desk and dial [phone].   Call this number if you have problems the morning of surgery: [phone]   Remember:   Do not eat food:After Midnight.  Do not drink clear liquids: After Midnight.  Take these medicines the morning of surgery with A SIP OF WATER: NA   Do not wear jewelry, make-up or nail polish.  Do not wear lotions, powders, or perfumes. You may wear deodorant.  Do not shave 48 hours prior to surgery.  Do not bring valuables to the hospital.  Contacts, dentures or bridgework may not be worn into surgery.  Leave suitcase in the car. After surgery it may be brought to your room.  For patients admitted to the hospital, checkout time is 11:00 AM the day of discharge.   Patients discharged the day of surgery will not be allowed to drive home.  Name and phone number of your driver: NA  Special Instructions: Shower using CHG 2 nights before surgery and the night before surgery.  If you shower the day of surgery use CHG.  Use special wash - you have one bottle of CHG for all showers.  You should use approximately 1/3 of the bottle for each shower.   Please read over the following fact sheets that you were given: Surgical Site Infection Prevention

## 2012-05-13 NOTE — H&P (Signed)
42 y.o. [id number]  Estimated Date of Delivery: 05/22/12 admitted at 39/[redacted] weeks gestation for repeat cesarean and tubal sterilization.  Patient declined VBAC attempt.    Prenatal Transfer Tool  Maternal Diabetes: No Genetic Screening: Normal Maternal Ultrasounds/Referrals: Normal Fetal Ultrasounds or other Referrals:  None Maternal Substance Abuse:  No Significant Maternal Medications:  None Significant Maternal Lab Results: None Other Significant Pregnancy Complications:  Previous cesarean delivery of preterm infant.  Afebrile, VSS Heart and Lungs: No active disease Abdomen: soft, gravid, EFW AGA. Cervical exam:  Deferred.  Impression: Term pregnancy.  Previous cesarean delivery.  Pt. requests repeat cesarean and tubal sterilization.  Plan:  Repeat Cesarean, Tubal sterilization.

## 2012-05-17 ENCOUNTER — Inpatient Hospital Stay (HOSPITAL_COMMUNITY)
Admission: RE | Admit: 2012-05-17 | Discharge: 2012-05-20 | DRG: 766 | Disposition: A | Discharge status: Home / Self Care | Payer: Medicaid Other | Source: Ambulatory Visit | Attending: Obstetrics & Gynecology | Admitting: Obstetrics & Gynecology

## 2012-05-17 ENCOUNTER — Encounter (HOSPITAL_COMMUNITY)
Admission: RE | Disposition: A | Discharge status: Home / Self Care | Payer: Self-pay | Source: Ambulatory Visit | Attending: Obstetrics & Gynecology

## 2012-05-17 ENCOUNTER — Inpatient Hospital Stay (HOSPITAL_COMMUNITY): Payer: Medicaid Other | Admitting: Anesthesiology

## 2012-05-17 ENCOUNTER — Encounter (HOSPITAL_COMMUNITY): Payer: Self-pay | Admitting: *Deleted

## 2012-05-17 ENCOUNTER — Encounter (HOSPITAL_COMMUNITY): Payer: Self-pay | Admitting: Anesthesiology

## 2012-05-17 DIAGNOSIS — O34219 Maternal care for unspecified type scar from previous cesarean delivery: Principal | ICD-10-CM | POA: Diagnosis present

## 2012-05-17 DIAGNOSIS — O09529 Supervision of elderly multigravida, unspecified trimester: Secondary | ICD-10-CM | POA: Diagnosis present

## 2012-05-17 DIAGNOSIS — Z3480 Encounter for supervision of other normal pregnancy, unspecified trimester: Secondary | ICD-10-CM

## 2012-05-17 DIAGNOSIS — Z302 Encounter for sterilization: Secondary | ICD-10-CM

## 2012-05-17 SURGERY — Surgical Case
Anesthesia: Spinal | Site: Abdomen | Laterality: Bilateral | Wound class: Clean Contaminated

## 2012-05-17 MED ORDER — NALOXONE HCL 0.4 MG/ML IJ SOLN: 0.4000 mg | INTRAMUSCULAR | Status: DC | PRN

## 2012-05-17 MED ORDER — KETOROLAC TROMETHAMINE 30 MG/ML IJ SOLN
30.0000 mg | Freq: Four times a day (QID) | INTRAMUSCULAR | Status: AC | PRN
  Administered 2012-05-17: 30 mg via INTRAVENOUS

## 2012-05-17 MED ORDER — OXYTOCIN 40 UNITS IN LACTATED RINGERS INFUSION - SIMPLE MED: 62.5000 mL/h | INTRAVENOUS | Status: AC

## 2012-05-17 MED ORDER — DIBUCAINE 1 % RE OINT: 1.0000 "application " | TOPICAL_OINTMENT | RECTAL | Status: DC | PRN

## 2012-05-17 MED ORDER — DIPHENHYDRAMINE HCL 50 MG/ML IJ SOLN: 12.5000 mg | INTRAMUSCULAR | Status: DC | PRN

## 2012-05-17 MED ORDER — PROMETHAZINE HCL 25 MG/ML IJ SOLN
INTRAMUSCULAR | Status: AC
  Administered 2012-05-17: 6.25 mg via INTRAVENOUS
  Filled 2012-05-17: qty 1

## 2012-05-17 MED ORDER — SCOPOLAMINE 1 MG/3DAYS TD PT72
MEDICATED_PATCH | TRANSDERMAL | Status: AC
  Administered 2012-05-17: 1.5 mg via TRANSDERMAL
  Filled 2012-05-17: qty 1

## 2012-05-17 MED ORDER — METOCLOPRAMIDE HCL 5 MG/ML IJ SOLN
INTRAMUSCULAR | Status: DC | PRN
  Administered 2012-05-17: 10 mg via INTRAVENOUS

## 2012-05-17 MED ORDER — GLYCOPYRROLATE 0.2 MG/ML IJ SOLN
INTRAMUSCULAR | Status: DC | PRN
  Administered 2012-05-17: 0.1 mg via INTRAVENOUS

## 2012-05-17 MED ORDER — FENTANYL CITRATE 0.05 MG/ML IJ SOLN: 25.0000 ug | INTRAMUSCULAR | Status: DC | PRN

## 2012-05-17 MED ORDER — ONDANSETRON HCL 4 MG/2ML IJ SOLN: 4.0000 mg | Freq: Three times a day (TID) | INTRAMUSCULAR | Status: DC | PRN

## 2012-05-17 MED ORDER — ONDANSETRON HCL 4 MG/2ML IJ SOLN
INTRAMUSCULAR | Status: AC
  Filled 2012-05-17: qty 2

## 2012-05-17 MED ORDER — KETOROLAC TROMETHAMINE 30 MG/ML IJ SOLN: 30.0000 mg | Freq: Four times a day (QID) | INTRAMUSCULAR | Status: AC | PRN

## 2012-05-17 MED ORDER — PROMETHAZINE HCL 25 MG/ML IJ SOLN: 6.2500 mg | INTRAMUSCULAR | Status: DC | PRN

## 2012-05-17 MED ORDER — SIMETHICONE 80 MG PO CHEW
80.0000 mg | CHEWABLE_TABLET | Freq: Three times a day (TID) | ORAL | Status: DC
  Administered 2012-05-18 – 2012-05-19 (×7): 80 mg via ORAL

## 2012-05-17 MED ORDER — DEXTROSE 5 % IV SOLN
3.0000 g | INTRAVENOUS | Status: AC
  Administered 2012-05-17: 3 g via INTRAVENOUS
  Filled 2012-05-17: qty 3000

## 2012-05-17 MED ORDER — ONDANSETRON HCL 4 MG/2ML IJ SOLN: 4.0000 mg | INTRAMUSCULAR | Status: DC | PRN

## 2012-05-17 MED ORDER — BUPIVACAINE IN DEXTROSE 0.75-8.25 % IT SOLN
INTRATHECAL | Status: DC | PRN
  Administered 2012-05-17: 1.6 mL via INTRATHECAL

## 2012-05-17 MED ORDER — OXYCODONE-ACETAMINOPHEN 5-325 MG PO TABS
1.0000 | ORAL_TABLET | ORAL | Status: DC | PRN
  Administered 2012-05-18: 2 via ORAL
  Administered 2012-05-18: 1 via ORAL
  Administered 2012-05-18 (×2): 2 via ORAL
  Administered 2012-05-18: 1 via ORAL
  Administered 2012-05-18 – 2012-05-20 (×8): 2 via ORAL
  Filled 2012-05-17: qty 2
  Filled 2012-05-17: qty 1
  Filled 2012-05-17 (×10): qty 2
  Filled 2012-05-17: qty 1

## 2012-05-17 MED ORDER — FENTANYL CITRATE 0.05 MG/ML IJ SOLN
INTRAMUSCULAR | Status: DC | PRN
  Administered 2012-05-17: 25 ug via INTRATHECAL

## 2012-05-17 MED ORDER — SCOPOLAMINE 1 MG/3DAYS TD PT72
1.0000 | MEDICATED_PATCH | Freq: Once | TRANSDERMAL | Status: DC
  Filled 2012-05-17: qty 1

## 2012-05-17 MED ORDER — MENTHOL 3 MG MT LOZG: 1.0000 | LOZENGE | OROMUCOSAL | Status: DC | PRN

## 2012-05-17 MED ORDER — TETANUS-DIPHTH-ACELL PERTUSSIS 5-2.5-18.5 LF-MCG/0.5 IM SUSP
0.5000 mL | Freq: Once | INTRAMUSCULAR | Status: AC
  Administered 2012-05-19: 0.5 mL via INTRAMUSCULAR
  Filled 2012-05-17: qty 0.5

## 2012-05-17 MED ORDER — METOCLOPRAMIDE HCL 5 MG/ML IJ SOLN: 10.0000 mg | Freq: Three times a day (TID) | INTRAMUSCULAR | Status: DC | PRN

## 2012-05-17 MED ORDER — OXYTOCIN 10 UNIT/ML IJ SOLN
INTRAMUSCULAR | Status: AC
  Filled 2012-05-17: qty 4

## 2012-05-17 MED ORDER — DIPHENHYDRAMINE HCL 50 MG/ML IJ SOLN: 25.0000 mg | INTRAMUSCULAR | Status: DC | PRN

## 2012-05-17 MED ORDER — MORPHINE SULFATE (PF) 0.5 MG/ML IJ SOLN
INTRAMUSCULAR | Status: DC | PRN
  Administered 2012-05-17: .1 mg via INTRATHECAL

## 2012-05-17 MED ORDER — EPHEDRINE 5 MG/ML INJ
INTRAVENOUS | Status: AC
  Filled 2012-05-17: qty 10

## 2012-05-17 MED ORDER — LANOLIN HYDROUS EX OINT: 1.0000 "application " | TOPICAL_OINTMENT | CUTANEOUS | Status: DC | PRN

## 2012-05-17 MED ORDER — NALOXONE HCL 1 MG/ML IJ SOLN
1.0000 ug/kg/h | INTRAMUSCULAR | Status: DC | PRN
  Filled 2012-05-17: qty 2

## 2012-05-17 MED ORDER — ZOLPIDEM TARTRATE 5 MG PO TABS: 5.0000 mg | ORAL_TABLET | Freq: Every evening | ORAL | Status: DC | PRN

## 2012-05-17 MED ORDER — MIDAZOLAM HCL 2 MG/2ML IJ SOLN: 0.5000 mg | Freq: Once | INTRAMUSCULAR | Status: DC | PRN

## 2012-05-17 MED ORDER — DIPHENHYDRAMINE HCL 25 MG PO CAPS
25.0000 mg | ORAL_CAPSULE | ORAL | Status: DC | PRN
  Administered 2012-05-18: 25 mg via ORAL
  Filled 2012-05-17 (×2): qty 1

## 2012-05-17 MED ORDER — DIPHENHYDRAMINE HCL 25 MG PO CAPS
25.0000 mg | ORAL_CAPSULE | Freq: Four times a day (QID) | ORAL | Status: DC | PRN
  Administered 2012-05-18: 25 mg via ORAL

## 2012-05-17 MED ORDER — LACTATED RINGERS IV SOLN
INTRAVENOUS | Status: DC | PRN
  Administered 2012-05-17: 14:00:00 via INTRAVENOUS

## 2012-05-17 MED ORDER — SCOPOLAMINE 1 MG/3DAYS TD PT72: 1.0000 | MEDICATED_PATCH | Freq: Once | TRANSDERMAL | Status: DC

## 2012-05-17 MED ORDER — NALBUPHINE HCL 10 MG/ML IJ SOLN
5.0000 mg | INTRAMUSCULAR | Status: DC | PRN
  Filled 2012-05-17: qty 1

## 2012-05-17 MED ORDER — ONDANSETRON HCL 4 MG PO TABS: 4.0000 mg | ORAL_TABLET | ORAL | Status: DC | PRN

## 2012-05-17 MED ORDER — SODIUM CHLORIDE 0.9 % IJ SOLN: 3.0000 mL | INTRAMUSCULAR | Status: DC | PRN

## 2012-05-17 MED ORDER — IBUPROFEN 600 MG PO TABS
600.0000 mg | ORAL_TABLET | Freq: Four times a day (QID) | ORAL | Status: DC
  Administered 2012-05-18 – 2012-05-20 (×10): 600 mg via ORAL
  Filled 2012-05-17 (×10): qty 1

## 2012-05-17 MED ORDER — EPHEDRINE SULFATE 50 MG/ML IJ SOLN
INTRAMUSCULAR | Status: DC | PRN
  Administered 2012-05-17 (×2): 10 mg via INTRAVENOUS

## 2012-05-17 MED ORDER — LACTATED RINGERS IV SOLN
INTRAVENOUS | Status: DC
  Administered 2012-05-17: 23:00:00 via INTRAVENOUS

## 2012-05-17 MED ORDER — MEPERIDINE HCL 25 MG/ML IJ SOLN: 6.2500 mg | INTRAMUSCULAR | Status: DC | PRN

## 2012-05-17 MED ORDER — ONDANSETRON HCL 4 MG/2ML IJ SOLN
INTRAMUSCULAR | Status: DC | PRN
  Administered 2012-05-17: 4 mg via INTRAVENOUS

## 2012-05-17 MED ORDER — OXYTOCIN 40 UNITS IN LACTATED RINGERS INFUSION - SIMPLE MED
INTRAVENOUS | Status: DC | PRN
  Administered 2012-05-17: 40 [IU] via INTRAVENOUS

## 2012-05-17 MED ORDER — PHENYLEPHRINE HCL 10 MG/ML IJ SOLN
INTRAMUSCULAR | Status: DC | PRN
  Administered 2012-05-17: 120 ug via INTRAVENOUS
  Administered 2012-05-17 (×2): 40 ug via INTRAVENOUS

## 2012-05-17 MED ORDER — FENTANYL CITRATE 0.05 MG/ML IJ SOLN
INTRAMUSCULAR | Status: AC
  Filled 2012-05-17: qty 2

## 2012-05-17 MED ORDER — WITCH HAZEL-GLYCERIN EX PADS: 1.0000 "application " | MEDICATED_PAD | CUTANEOUS | Status: DC | PRN

## 2012-05-17 MED ORDER — SIMETHICONE 80 MG PO CHEW
80.0000 mg | CHEWABLE_TABLET | ORAL | Status: DC | PRN
  Administered 2012-05-18: 80 mg via ORAL

## 2012-05-17 MED ORDER — PHENYLEPHRINE 40 MCG/ML (10ML) SYRINGE FOR IV PUSH (FOR BLOOD PRESSURE SUPPORT)
PREFILLED_SYRINGE | INTRAVENOUS | Status: AC
  Filled 2012-05-17: qty 5

## 2012-05-17 MED ORDER — LACTATED RINGERS IV SOLN
INTRAVENOUS | Status: DC
  Administered 2012-05-17 (×3): via INTRAVENOUS

## 2012-05-17 MED ORDER — ACETAMINOPHEN 10 MG/ML IV SOLN
1000.0000 mg | Freq: Four times a day (QID) | INTRAVENOUS | Status: AC | PRN
  Filled 2012-05-17: qty 100

## 2012-05-17 MED ORDER — PRENATAL MULTIVITAMIN CH
1.0000 | ORAL_TABLET | Freq: Every day | ORAL | Status: DC
  Filled 2012-05-17 (×2): qty 1

## 2012-05-17 MED ORDER — SENNOSIDES-DOCUSATE SODIUM 8.6-50 MG PO TABS
2.0000 | ORAL_TABLET | Freq: Every day | ORAL | Status: DC
  Administered 2012-05-18 – 2012-05-19 (×2): 2 via ORAL

## 2012-05-17 MED ORDER — MORPHINE SULFATE 0.5 MG/ML IJ SOLN
INTRAMUSCULAR | Status: AC
  Filled 2012-05-17: qty 10

## 2012-05-17 SURGICAL SUPPLY — 35 items
CLIP FILSHIE TUBAL LIGA STRL (Clip) ×1 IMPLANT
CLOTH BEACON ORANGE TIMEOUT ST (SAFETY) ×2 IMPLANT
CONTAINER PREFILL 10% NBF 15ML (MISCELLANEOUS) IMPLANT
DRAPE LG THREE QUARTER DISP (DRAPES) ×2 IMPLANT
DRSG OPSITE POSTOP 4X10 (GAUZE/BANDAGES/DRESSINGS) ×2 IMPLANT
DURAPREP 26ML APPLICATOR (WOUND CARE) ×2 IMPLANT
ELECT REM PT RETURN 9FT ADLT (ELECTROSURGICAL) ×2
ELECTRODE REM PT RTRN 9FT ADLT (ELECTROSURGICAL) ×1 IMPLANT
EXTRACTOR VACUUM M CUP 4 TUBE (SUCTIONS) IMPLANT
GLOVE ECLIPSE 6.0 STRL STRAW (GLOVE) ×2 IMPLANT
GLOVE ECLIPSE 6.5 STRL STRAW (GLOVE) ×2 IMPLANT
GOWN STRL REIN XL XLG (GOWN DISPOSABLE) ×4 IMPLANT
KIT ABG SYR 3ML LUER SLIP (SYRINGE) IMPLANT
NDL HYPO 25X5/8 SAFETYGLIDE (NEEDLE) ×1 IMPLANT
NEEDLE HYPO 25X5/8 SAFETYGLIDE (NEEDLE) ×2 IMPLANT
NS IRRIG 1000ML POUR BTL (IV SOLUTION) ×2 IMPLANT
PACK C SECTION WH (CUSTOM PROCEDURE TRAY) ×2 IMPLANT
PAD OB MATERNITY 4.3X12.25 (PERSONAL CARE ITEMS) ×2 IMPLANT
RTRCTR C-SECT PINK 25CM LRG (MISCELLANEOUS) IMPLANT
SLEEVE SCD COMPRESS KNEE MED (MISCELLANEOUS) ×1 IMPLANT
STAPLER VISISTAT 35W (STAPLE) IMPLANT
SUT PLAIN 0 NONE (SUTURE) ×1 IMPLANT
SUT VIC AB 0 CT1 27 (SUTURE) ×6
SUT VIC AB 0 CT1 27XBRD ANBCTR (SUTURE) ×3 IMPLANT
SUT VIC AB 1 CTX 36 (SUTURE) ×4
SUT VIC AB 1 CTX36XBRD ANBCTRL (SUTURE) ×2 IMPLANT
SUT VIC AB 3-0 CT1 27 (SUTURE) ×2
SUT VIC AB 3-0 CT1 TAPERPNT 27 (SUTURE) ×1 IMPLANT
SUT VIC AB 3-0 PS2 18 (SUTURE) ×2
SUT VIC AB 3-0 PS2 18XBRD (SUTURE) IMPLANT
SUT VIC AB 3-0 SH 27 (SUTURE) ×2
SUT VIC AB 3-0 SH 27X BRD (SUTURE) IMPLANT
TOWEL OR 17X24 6PK STRL BLUE (TOWEL DISPOSABLE) ×6 IMPLANT
TRAY FOLEY CATH 14FR (SET/KITS/TRAYS/PACK) ×2 IMPLANT
WATER STERILE IRR 1000ML POUR (IV SOLUTION) ×2 IMPLANT

## 2012-05-17 NOTE — Transfer of Care (Signed)
Immediate Anesthesia Transfer of Care Note  Patient: Madeline Silva  Procedure(s) Performed: Procedure(s): CESAREAN SECTION WITH BILATERAL TUBAL LIGATION (Bilateral)  Patient Location: PACU  Anesthesia Type:Spinal  Level of Consciousness: awake, alert  and oriented  Airway & Oxygen Therapy: Patient Spontanous Breathing and Patient connected to nasal cannula oxygen  Post-op Assessment: Report given to PACU RN and Post -op Vital signs reviewed and stable  Post vital signs: Reviewed and stable  Complications: No apparent anesthesia complications

## 2012-05-17 NOTE — Anesthesia Preprocedure Evaluation (Addendum)
Anesthesia Evaluation  Patient identified by MRN, date of birth, ID band Patient awake    Reviewed: Allergy & Precautions, H&P , NPO status , Patient's Chart, lab work & pertinent test results  Airway Mallampati: III      Dental no notable dental hx.    Pulmonary neg pulmonary ROS,  breath sounds clear to auscultation  Pulmonary exam normal       Cardiovascular Exercise Tolerance: Good negative cardio ROS  Rhythm:regular Rate:Normal     Neuro/Psych  Headaches, negative neurological ROS  negative psych ROS   GI/Hepatic negative GI ROS, Neg liver ROS,   Endo/Other  negative endocrine ROSMorbid obesity  Renal/GU negative Renal ROS  negative genitourinary   Musculoskeletal   Abdominal Normal abdominal exam  (+)   Peds  Hematology negative hematology ROS (+)   Anesthesia Other Findings    migraines  Notes state patient has had seizures... Upon direct questioning, patient denies any seizure history  Reproductive/Obstetrics (+) Pregnancy                          Anesthesia Physical Anesthesia Plan  ASA: III  Anesthesia Plan: Spinal   Post-op Pain Management:    Induction:   Airway Management Planned:   Additional Equipment:   Intra-op Plan:   Post-operative Plan:   Informed Consent: I have reviewed the patients History and Physical, chart, labs and discussed the procedure including the risks, benefits and alternatives for the proposed anesthesia with the patient or authorized representative who has indicated his/her understanding and acceptance.     Plan Discussed with: Anesthesiologist, CRNA and Surgeon  Anesthesia Plan Comments:         Anesthesia Quick Evaluation

## 2012-05-17 NOTE — Anesthesia Postprocedure Evaluation (Signed)
  Anesthesia Post-op Note  Anesthesia Post Note  Patient: Madeline Silva  Procedure(s) Performed: Procedure(s) (LRB): CESAREAN SECTION WITH BILATERAL TUBAL LIGATION (Bilateral)  Anesthesia type: Spinal  Patient location: PACU  Post pain: Pain level controlled  Post assessment: Post-op Vital signs reviewed  Last Vitals:  Filed Vitals:   05/17/12 1530  BP: 130/70  Pulse: 71  Temp: 36.3 C  Resp: 16    Post vital signs: Reviewed  Level of consciousness: awake  Complications: No apparent anesthesia complications

## 2012-05-17 NOTE — Anesthesia Procedure Notes (Signed)
Spinal  Patient location during procedure: OR Start time: 05/17/2012 1:30 PM Staffing Anesthesiologist: Angus Seller., [facility] R. Performed by: anesthesiologist  Preanesthetic Checklist Completed: patient identified, site marked, surgical consent, pre-op evaluation, timeout performed, IV checked, risks and benefits discussed and monitors and equipment checked Spinal Block Patient position: sitting Prep: DuraPrep Patient monitoring: heart rate, cardiac monitor, continuous pulse ox and blood pressure Approach: midline Location: L3-4 Injection technique: single-shot Needle Needle type: Sprotte  Needle gauge: 24 G Needle length: 9 cm Assessment Sensory level: T4 Additional Notes Patient identified.  Risk benefits discussed including failed block, incomplete pain control, headache, nerve damage, paralysis, blood pressure changes, nausea, vomiting, reactions to medication both toxic or allergic, and postpartum back pain.  Patient expressed understanding and wished to proceed.  All questions were answered.  Sterile technique used throughout procedure.  CSF was clear.  No parasthesia or other complications.  Please see nursing notes for vital signs.

## 2012-05-17 NOTE — Op Note (Addendum)
Patient Name: Madeline Silva MRN: [id number]  Date of Surgery: 05/17/2012    PREOPERATIVE DIAGNOSIS: Previous C/S, Voluntary sterilization  POSTOPERATIVE DIAGNOSIS: Same   PROCEDURE: Repeat low transverse cesarean section, Tubal occulusion with Filshie clips   SURGEON: Caralyn Guile. Arlyce Dice M.D.  ASSISTANT: Luvenia Redden, M.D.  ANESTHESIA: Spinal  ESTIMATED BLOOD LOSS: 800  FINDINGS: Female, Apgar 9,9; BW 6 lbs. 11 oz., normal adnexa, normal uterus.   INDICATIONS: Previous cesarean delivery, patient declines VBAC attempt.  Voluntary sterilization.  PROCEDURE IN DETAIL: The patient was taken to the operating room and spinal anesthesia was placed.  She was then placed in the supine position with left lateral displacement of the uterus. The abdomen was prepped and draped in a sterile fashion and the bladder was catheterized.  A low transverse abdominal incision was made and carried down to the fascia. The fascia was opened transversely and the rectus sheath was dissected from the underlying rectus muscle. The rectus midline was identified and opened by sharp and blunt dissection. The peritoneum was opened. An Alexis retractor was placed and the lower uterine segment was identified, entered transversely by careful sharp dissection, and extended bluntly.  The infant was delivered with the aid of vacuum. The placenta was sent for cord blood collection. The uterus was bluntly curettage. The lower segment was closed with running interlocking Vicryl 1 suture. The fallopian tubes were identified and elevated with Babcock clamps.  Filshie clips were placed at the isthmic/ampullary area to effect sterilization.  The peritoneum and rectus muscle were closed in the midline with running 3-0 Vicryl suture. The fascia was closed with running 0 Vicryl suture and the skin was closed with staples. All sponge and instrument counts were correct.  The patient tolerated the procedure well and left the operating room in good  condition.

## 2012-05-17 NOTE — Progress Notes (Signed)
I have interviewed and performed the pertinent exams on my patient to confirm that there have been no significant changes in her condition since the dictation of her history and physical exam.  

## 2012-05-18 ENCOUNTER — Encounter (HOSPITAL_COMMUNITY): Payer: Self-pay

## 2012-05-18 LAB — CBC
HCT: 26.9 % — ABNORMAL LOW (ref 36.0–46.0)
Hemoglobin: 9 g/dL — ABNORMAL LOW (ref 12.0–15.0)
RBC: 3 MIL/uL — ABNORMAL LOW (ref 3.87–5.11)
WBC: 9.9 10*3/uL (ref 4.0–10.5)

## 2012-05-18 MED ORDER — PNEUMOCOCCAL VAC POLYVALENT 25 MCG/0.5ML IJ INJ
0.5000 mL | INJECTION | INTRAMUSCULAR | Status: DC
  Filled 2012-05-18: qty 0.5

## 2012-05-18 MED ORDER — INFLUENZA VIRUS VACC SPLIT PF IM SUSP
0.5000 mL | INTRAMUSCULAR | Status: AC
  Administered 2012-05-19: 0.5 mL via INTRAMUSCULAR
  Filled 2012-05-18: qty 0.5

## 2012-05-18 NOTE — Progress Notes (Signed)
Post Op Day 1 Subjective: no complaints  Objective: Blood pressure 122/80, pulse 83, temperature 97.8 F (36.6 C), temperature source Oral, resp. rate 18, height 5\' 6"  (1.676 m), weight 127.007 kg (280 lb), last menstrual period 08/08/2011, SpO2 96.00%, trying to breast feed but is easily discouraged.  Physical Exam:  General: alert Lochia: appropriate Uterine Fundus: firm Incision: no significant drainage   Recent Labs  05/18/12 0605  HGB 9.0*  HCT 26.9*    Assessment/Plan: Plan for discharge tomorrow if patient is doing well and requests early discharge.  OK to remove staples on day 2 if patient is discharged.  Patient desires baby circ in hospital but has not paid.  Discussed circ in office.  Patient will decide.   LOS: 1 day   Jeanpaul Biehl D 05/18/2012, 10:08 AM

## 2012-05-18 NOTE — Anesthesia Postprocedure Evaluation (Signed)
  Anesthesia Post-op Note  Patient: Madeline Silva  Procedure(s) Performed: Procedure(s): CESAREAN SECTION WITH BILATERAL TUBAL LIGATION (Bilateral)  Patient Location: PACU and Mother/Baby  Anesthesia Type:Spinal  Level of Consciousness: awake, alert , oriented and patient cooperative  Airway and Oxygen Therapy: Patient Spontanous Breathing  Post-op Pain: mild  Post-op Assessment: Post-op Vital signs reviewed and Patient's Cardiovascular Status Stable  Post-op Vital Signs: Reviewed and stable  Complications: No apparent anesthesia complications

## 2012-05-18 NOTE — Progress Notes (Signed)
UR chart review completed.  

## 2012-05-19 NOTE — Progress Notes (Signed)
  Patient is eating, ambulating, voiding.  Pain control is good.  Filed Vitals:   05/18/12 0641 05/18/12 0840 05/18/12 1310 05/19/12 0548  BP: 122/84 122/80 133/79 138/72  Pulse: 86 83 95 85  Temp: 97.5 F (36.4 C) 97.8 F (36.6 C) 98.2 F (36.8 C) 98.1 F (36.7 C)  TempSrc: Oral Oral Oral Oral  Resp: 18 18 18 18   Height:      Weight:      SpO2: 98% 96% 97%     lungs:   clear to auscultation cor:    RRR Abdomen:  soft, appropriate tenderness, incisions intact and without erythema or exudate ex:    no cords   Lab Results  Component Value Date   WBC 9.9 05/18/2012   HGB 9.0* 05/18/2012   HCT 26.9* 05/18/2012   MCV 89.7 05/18/2012   PLT 133* 05/18/2012    --/--/O POS (03/25 1105)/RI  A/P    Post operative day 2.  Routine post op and postpartum care.  Expect d/c tomorrow.  Percocet for pain control. Circ in office.

## 2012-05-20 MED ORDER — OXYCODONE-ACETAMINOPHEN 5-325 MG PO TABS: 1.0000 | ORAL_TABLET | ORAL | Status: DC | PRN

## 2012-05-20 MED ORDER — FUROSEMIDE 20 MG PO TABS: 20.0000 mg | ORAL_TABLET | Freq: Every day | ORAL | Status: DC

## 2012-05-20 NOTE — Discharge Summary (Signed)
NAMEMarland Silva  LUCEAL, [name]:  [id number]  MEDICAL RECORD NO.:  [id number]  LOCATION:  9101                          FACILITY:  [facility]  PHYSICIAN:  Malva Limes, M.D.    DATE OF BIRTH:  1971-01-02  DATE OF ADMISSION:  05/17/2012 DATE OF DISCHARGE:  05/20/2012                              DISCHARGE SUMMARY   PRINCIPAL DISCHARGE DIAGNOSES: 1. Intrauterine pregnancy at term. 2. History of prior cesarean section. 3. Patient declines attempted vaginal birth. 4. Patient desires permanent sterilization.  PROCEDURES: 1. Repeat low transverse cesarean section. 2. Bilateral tubal sterilization.  HISTORY OF PRESENT ILLNESS:  Ms. Dearcos is a 42 year old black female, G4, now P1-1-2-2 at term, who was admitted by Dr. Ilda Mori on May 17, 2012, for repeat C-section and bilateral tubal ligation.  The patient's prenatal care was uncomplicated.  She was offered an attempted vaginal birth and declined.  HOSPITAL COURSE:  Patient was admitted, underwent a repeat cesarean section and bilateral tubal ligation.  Complete description of this can be found in dictated operative note.  The patient's postop course was benign.  Her postop hemoglobin was 9.0.  She has no difficulty with ambulation.  At the time of discharge, she was eating a regular diet. She was having adequate pain control.  She did express concern of lower extremity edema.  She was given Lasix for this, as she was not breast feeding.  The patient's incision was healing well at time of discharge.  DISCHARGE MEDICATIONS:  She was sent home with Percocet to take p.r.n., Lasix 20 mg p.o. q.a.m. p.r.n. for 10 days, and p.o. iron over the counter.  DISCHARGE INSTRUCTIONS:  She will follow up in the office in 4 weeks. Baby is going to return to the office in 1 week for a circumcision.          ______________________________ Malva Limes, M.D.     MA/MEDQ  D:  05/20/2012  T:  05/20/2012  Job:  [id number]

## 2012-05-20 NOTE — Progress Notes (Signed)
POD#3 Pt states that she is doing well. Ready for discharge. IMP./ stable PLAN/ routine care.

## 2012-05-21 LAB — TYPE AND SCREEN
Donor AG Type: NEGATIVE
Donor AG Type: NEGATIVE
Unit division: 0

## 2012-07-28 ENCOUNTER — Ambulatory Visit: Payer: Medicaid Other | Admitting: Physical Therapy

## 2012-08-03 ENCOUNTER — Ambulatory Visit: Payer: Medicaid Other | Attending: Orthopaedic Surgery | Admitting: Physical Therapy

## 2012-09-03 ENCOUNTER — Encounter (HOSPITAL_COMMUNITY): Payer: Self-pay | Admitting: Family

## 2012-09-03 ENCOUNTER — Inpatient Hospital Stay (HOSPITAL_COMMUNITY)
Admission: AD | Admit: 2012-09-03 | Discharge: 2012-09-03 | Disposition: A | Discharge status: Home / Self Care | Payer: Self-pay | Source: Ambulatory Visit | Attending: Obstetrics and Gynecology | Admitting: Obstetrics and Gynecology

## 2012-09-03 ENCOUNTER — Inpatient Hospital Stay (HOSPITAL_COMMUNITY): Payer: Self-pay

## 2012-09-03 DIAGNOSIS — R109 Unspecified abdominal pain: Secondary | ICD-10-CM | POA: Insufficient documentation

## 2012-09-03 DIAGNOSIS — N83209 Unspecified ovarian cyst, unspecified side: Secondary | ICD-10-CM | POA: Insufficient documentation

## 2012-09-03 LAB — URINALYSIS, ROUTINE W REFLEX MICROSCOPIC
Glucose, UA: NEGATIVE mg/dL
Hgb urine dipstick: NEGATIVE
Ketones, ur: 15 mg/dL — AB
Leukocytes, UA: NEGATIVE
pH: 6 (ref 5.0–8.0)

## 2012-09-03 LAB — POCT PREGNANCY, URINE: Preg Test, Ur: NEGATIVE

## 2012-09-03 MED ORDER — ACETAMINOPHEN-CODEINE 300-30 MG PO TABS: 1.0000 | ORAL_TABLET | ORAL | Status: DC | PRN

## 2012-09-03 MED ORDER — IBUPROFEN 600 MG PO TABS: 600.0000 mg | ORAL_TABLET | Freq: Four times a day (QID) | ORAL | Status: DC | PRN

## 2012-09-03 NOTE — MAU Provider Note (Signed)
History     CSN: [id number]  Arrival date and time: 09/03/12 2049   None     Chief Complaint  Patient presents with  . Abdominal Pain   HPI  Onset of bilateral lower abdominal pain since yesterday.  Patient states it is a "pulling sensation" going down right leg. Denies N/V/D, no constipation, no vaginal discharge, LMP 07/26/12.  C/S and bilateral tubal ligation on 05/17/12.  No history of having similar pain in the past.  Standing increases the pain.  Sitting or laying still improves the pain.     Past Medical History  Diagnosis Date  . Seizures     migraines    Past Surgical History  Procedure Laterality Date  . Cesarean section    . Cesarean section with bilateral tubal ligation Bilateral 05/17/2012    Procedure: CESAREAN SECTION WITH BILATERAL TUBAL LIGATION;  Surgeon: Mickel Baas, MD;  Location: [facility];  Service: Obstetrics;  Laterality: Bilateral;    No family history on file.  History  Substance Use Topics  . Smoking status: Current Every Day Smoker -- 0.25 packs/day    Types: Cigarettes  . Smokeless tobacco: Never Used  . Alcohol Use: No    Allergies: No Known Allergies  Prescriptions prior to admission  Medication Sig Dispense Refill  . furosemide (LASIX) 20 MG tablet Take 1 tablet (20 mg total) by mouth daily.  10 tablet  0    Review of Systems  Constitutional: Negative for fever and chills.  Gastrointestinal: Positive for abdominal pain. Negative for nausea and vomiting.  Genitourinary: Negative.   All other systems reviewed and are negative.   Physical Exam   Blood pressure 143/77, pulse 73, temperature 98.2 F (36.8 C), temperature source Oral, resp. rate 18, last menstrual period 07/26/2012, not currently breastfeeding.  Physical Exam  Constitutional: She is oriented to person, place, and time. She appears well-developed and well-nourished. No distress.  HENT:  Head: Normocephalic.  Eyes: Pupils are equal, round, and reactive to light.   Neck: Normal range of motion. Neck supple.  Cardiovascular: Normal rate, regular rhythm and normal heart sounds.   Respiratory: Effort normal and breath sounds normal.  GI: Soft. She exhibits no mass. There is tenderness (suprapubic area). There is no guarding.  Genitourinary:  Negative cervical motion tenderness; adnexa and uterus difficult to palpate secondary to weight.  Neurological: She is alert and oriented to person, place, and time. She has normal reflexes.  Skin: Skin is warm and dry.    MAU Course  Procedures  Results for orders placed during the hospital encounter of 09/03/12 (from the past 24 hour(s))  URINALYSIS, ROUTINE W REFLEX MICROSCOPIC     Status: Abnormal   Collection Time    09/03/12  9:00 PM      Result Value Range   Color, Urine YELLOW  YELLOW   APPearance CLEAR  CLEAR   Specific Gravity, Urine >1.030 (*) 1.005 - 1.030   pH 6.0  5.0 - 8.0   Glucose, UA NEGATIVE  NEGATIVE mg/dL   Hgb urine dipstick NEGATIVE  NEGATIVE   Bilirubin Urine NEGATIVE  NEGATIVE   Ketones, ur 15 (*) NEGATIVE mg/dL   Protein, ur NEGATIVE  NEGATIVE mg/dL   Urobilinogen, UA 0.2  0.0 - 1.0 mg/dL   Nitrite NEGATIVE  NEGATIVE   Leukocytes, UA NEGATIVE  NEGATIVE  POCT PREGNANCY, URINE     Status: None   Collection Time    09/03/12  9:10 PM  Result Value Range   Preg Test, Ur NEGATIVE  NEGATIVE    Ultrasound: IMPRESSION:  6.9 cm diameter simple appearing cyst in the right ovary is likely  benign. Yearly follow-up is recommended.Uterus, endometrium, and  left ovary are unremarkable.  Assessment and Plan  Right Ovarian Cyst  Plan: DC to home RX Ibuprofen Schedule follow-up appointment in office.  [facility] 09/03/2012, 9:24 PM

## 2012-09-03 NOTE — MAU Note (Signed)
Onset of bilateral lower abdominal pain since yesterday, patient states it is a "pulling sensation" going down right leg. Denies N/V/D, no constipation, no vaginal discharge, LMP 07/26/12, had tubal ligation. Had C/S 05/17/12

## 2012-09-04 ENCOUNTER — Inpatient Hospital Stay (HOSPITAL_COMMUNITY)
Admission: AD | Admit: 2012-09-04 | Discharge: 2012-09-04 | Disposition: A | Discharge status: Home / Self Care | Payer: Self-pay | Source: Ambulatory Visit | Attending: Obstetrics and Gynecology | Admitting: Obstetrics and Gynecology

## 2012-09-04 ENCOUNTER — Encounter (HOSPITAL_COMMUNITY): Payer: Self-pay | Admitting: *Deleted

## 2012-09-04 ENCOUNTER — Inpatient Hospital Stay (HOSPITAL_COMMUNITY): Payer: Self-pay

## 2012-09-04 DIAGNOSIS — N949 Unspecified condition associated with female genital organs and menstrual cycle: Secondary | ICD-10-CM | POA: Insufficient documentation

## 2012-09-04 DIAGNOSIS — N83209 Unspecified ovarian cyst, unspecified side: Secondary | ICD-10-CM | POA: Insufficient documentation

## 2012-09-04 MED ORDER — HYDROMORPHONE HCL PF 1 MG/ML IJ SOLN
1.0000 mg | Freq: Once | INTRAMUSCULAR | Status: AC
  Administered 2012-09-04: 1 mg via INTRAMUSCULAR
  Filled 2012-09-04: qty 1

## 2012-09-04 MED ORDER — OXYCODONE-ACETAMINOPHEN 5-325 MG PO TABS: 1.0000 | ORAL_TABLET | ORAL | Status: DC | PRN

## 2012-09-04 NOTE — MAU Provider Note (Signed)
History     CSN: [id number]  Arrival date and time: 09/04/12 1431   First Provider Initiated Contact with Patient 09/04/12 1501      Chief Complaint  Patient presents with  . Ovarian Cyst   HPI 42 y.o. [id number] with worsening pelvic pain. Diagnosed with > 6 cm ovarian cyst last night. Pain has worsened today, not controlled with Tylenol #3 and Motrin.   Past Medical History  Diagnosis Date  . Seizures     migraines    Past Surgical History  Procedure Laterality Date  . Cesarean section    . Cesarean section with bilateral tubal ligation Bilateral 05/17/2012    Procedure: CESAREAN SECTION WITH BILATERAL TUBAL LIGATION;  Surgeon: Mickel Baas, MD;  Location: [facility];  Service: Obstetrics;  Laterality: Bilateral;    History reviewed. No pertinent family history.  History  Substance Use Topics  . Smoking status: Current Every Day Smoker -- 0.25 packs/day    Types: Cigarettes  . Smokeless tobacco: Never Used  . Alcohol Use: No    Allergies: No Known Allergies  Prescriptions prior to admission  Medication Sig Dispense Refill  . ibuprofen (ADVIL,MOTRIN) 600 MG tablet Take 1 tablet (600 mg total) by mouth every 6 (six) hours as needed for pain.  30 tablet  0  . [DISCONTINUED] Acetaminophen-Codeine (TYLENOL/CODEINE #3) 300-30 MG per tablet Take 1 tablet by mouth every 4 (four) hours as needed for pain.  30 tablet  0    Review of Systems  Constitutional: Negative.   Respiratory: Negative.   Cardiovascular: Negative.   Gastrointestinal: Positive for abdominal pain. Negative for nausea, vomiting, diarrhea and constipation.  Genitourinary: Negative for dysuria, urgency, frequency, hematuria and flank pain.       Negative for vaginal bleeding, vaginal discharge, dyspareunia  Musculoskeletal: Negative.   Neurological: Negative.   Psychiatric/Behavioral: Negative.    Physical Exam   Blood pressure 141/72, pulse 94, temperature 97.4 F (36.3 C), temperature source Oral,  resp. rate 18, last menstrual period 07/26/2012.  Physical Exam  Nursing note and vitals reviewed. Constitutional: She is oriented to person, place, and time. She appears well-developed and well-nourished. She appears distressed.  Cardiovascular: Normal rate.   Respiratory: Effort normal.  GI: Soft. She exhibits no mass. There is tenderness (LUQ/LLQ). There is guarding. There is no rebound.  Musculoskeletal: Normal range of motion.  Neurological: She is alert and oriented to person, place, and time.  Skin: Skin is warm and dry.  Psychiatric: She has a normal mood and affect.    MAU Course  Procedures Results for orders placed during the hospital encounter of 09/03/12 (from the past 24 hour(s))  URINALYSIS, ROUTINE W REFLEX MICROSCOPIC     Status: Abnormal   Collection Time    09/03/12  9:00 PM      Result Value Range   Color, Urine YELLOW  YELLOW   APPearance CLEAR  CLEAR   Specific Gravity, Urine >1.030 (*) 1.005 - 1.030   pH 6.0  5.0 - 8.0   Glucose, UA NEGATIVE  NEGATIVE mg/dL   Hgb urine dipstick NEGATIVE  NEGATIVE   Bilirubin Urine NEGATIVE  NEGATIVE   Ketones, ur 15 (*) NEGATIVE mg/dL   Protein, ur NEGATIVE  NEGATIVE mg/dL   Urobilinogen, UA 0.2  0.0 - 1.0 mg/dL   Nitrite NEGATIVE  NEGATIVE   Leukocytes, UA NEGATIVE  NEGATIVE  POCT PREGNANCY, URINE     Status: None   Collection Time    09/03/12  9:10 PM  Result Value Range   Preg Test, Ur NEGATIVE  NEGATIVE   US Transvaginal Non-ob  09/04/2012   *RADIOLOGY REPORT*  Clinical Data: Increased right lower quadrant and pelvic pain. Follow-up large right ovarian cyst and evaluate for ovarian torsion.  TRANSVAGINAL ULTRASOUND OF PELVIS DOPPLER ULTRASOUND OF OVARIES  Technique:  Transvaginal ultrasound examination of the pelvis was performed including evaluation of the uterus, ovaries, adnexal regions, and pelvic cul-de-sac. Color and duplex Doppler ultrasound was utilized to evaluate blood flow to the ovaries.  Comparison:   09/03/2012  Findings:  Uterus:  10.6 x 5.4 x 7.2 cm.  Heterogeneous echogenicity of uterine myometrium, but no distinct fibroids identified.  Endometrium: Double layer thickness measures 4 mm transvaginally.  Right ovary: 7.6 x 6.8 x 6.7 cm.  A simple appearing cyst is again seen in the right ovary measuring 6.7 cm.  This has benign sonographic features.  Color Doppler ultrasound does show the presence of some blood flow within normal peripheral right ovarian tissue.  Left ovary: 3.5 x 1.5 x 2.7 cm.  Normal appearance.  Internal blood flow is seen on color Doppler ultrasound.  Other Findings:  No free fluid  Pulsed Doppler evaluation demonstrates normal low resistance arterial and venous waveforms within both ovaries.  IMPRESSION:  1.  No significant change in 6.7 cm simple right ovarian cyst, which has benign sonographic features.  If surgical evaluation is not performed, yearly follow-up by ultrasound is recommended in a reproductive age female.This recommendation follows the consensus statement:  Management of Asymptomatic Ovarian and Other Adnexal Cysts Imaged at [place]:  Society of Radiologists in Ultrasound Consensus Conference Statement.  Radiology 2010; [phone].  2.  No sonographic evidence for ovarian torsion.   Original Report Authenticated By: Myles Rosenthal, M.D.   US Transvaginal Non-ob  09/03/2012   *RADIOLOGY REPORT*  Clinical Data: Pelvic pain.  History of C-section and tubal ligations.  TRANSABDOMINAL AND TRANSVAGINAL ULTRASOUND OF PELVIS Technique:  Both transabdominal and transvaginal ultrasound examinations of the pelvis were performed. Transabdominal technique was performed for global imaging of the pelvis including uterus, ovaries, adnexal regions, and pelvic cul-de-sac.  It was necessary to proceed with endovaginal exam following the transabdominal exam to visualize the endometrium and ovaries.  Comparison:  None  Findings:  Uterus: The uterus is anteverted and measures 9.7 x 5.6 x 6.6 cm.  Homogeneous parenchymal echotexture without focal myometrial mass lesion.  Small Nabothian cyst in the cervix.  Endometrium: Normal endometrial stripe thickness at 5.4 mm.  No abnormal endometrial fluid collections.  Right ovary:  The right ovary measures 6.9 x 5.5 x 6.7 cm.  There is a simple appearing circumscribed cyst in the right ovary measuring up to about 6.9 x 4.9 x 5.9 cm diameter. This is almost certainly benign, but follow up ultrasound is recommended in 1 year according to the Society of Radiologists in Ultrasound 2010 Consensus Conference Statement (D Lenis Noon et al.  Management of Asymptomatic Ovarian and Other Adnexal Cysts Imaged at [place]:  Society of Radiologists in Ultrasound Consensus Conference Statement 2010. Radiology 256 (Sept 2010):  [phone].).Flow is demonstrated in the right ovary on color flow Doppler imaging.  Left ovary: The left ovary measures 3.1 x 1.5 x 2.4 cm.  Normal follicular changes are demonstrated.  No abnormal adnexal masses.  Other findings: No free fluid  IMPRESSION: 6.9 cm diameter simple appearing cyst in the right ovary is likely benign.  Yearly follow-up is recommended.Uterus, endometrium, and left ovary are unremarkable.   Original Report Authenticated By:  Burman Nieves, M.D.   US Pelvis Complete  09/03/2012   *RADIOLOGY REPORT*  Clinical Data: Pelvic pain.  History of C-section and tubal ligations.  TRANSABDOMINAL AND TRANSVAGINAL ULTRASOUND OF PELVIS Technique:  Both transabdominal and transvaginal ultrasound examinations of the pelvis were performed. Transabdominal technique was performed for global imaging of the pelvis including uterus, ovaries, adnexal regions, and pelvic cul-de-sac.  It was necessary to proceed with endovaginal exam following the transabdominal exam to visualize the endometrium and ovaries.  Comparison:  None  Findings:  Uterus: The uterus is anteverted and measures 9.7 x 5.6 x 6.6 cm. Homogeneous parenchymal echotexture without focal myometrial  mass lesion.  Small Nabothian cyst in the cervix.  Endometrium: Normal endometrial stripe thickness at 5.4 mm.  No abnormal endometrial fluid collections.  Right ovary:  The right ovary measures 6.9 x 5.5 x 6.7 cm.  There is a simple appearing circumscribed cyst in the right ovary measuring up to about 6.9 x 4.9 x 5.9 cm diameter. This is almost certainly benign, but follow up ultrasound is recommended in 1 year according to the Society of Radiologists in Ultrasound 2010 Consensus Conference Statement (D Lenis Noon et al.  Management of Asymptomatic Ovarian and Other Adnexal Cysts Imaged at [place]:  Society of Radiologists in Ultrasound Consensus Conference Statement 2010. Radiology 256 (Sept 2010):  [phone].).Flow is demonstrated in the right ovary on color flow Doppler imaging.  Left ovary: The left ovary measures 3.1 x 1.5 x 2.4 cm.  Normal follicular changes are demonstrated.  No abnormal adnexal masses.  Other findings: No free fluid  IMPRESSION: 6.9 cm diameter simple appearing cyst in the right ovary is likely benign.  Yearly follow-up is recommended.Uterus, endometrium, and left ovary are unremarkable.   Original Report Authenticated By: Burman Nieves, M.D.    Assessment and Plan   1. Ovarian cyst   Rx percocet 5/325 #15 given, continue motrin, call office tomorrow for follow up    Medication List    STOP taking these medications       Acetaminophen-Codeine 300-30 MG per tablet  Commonly known as:  TYLENOL/CODEINE #3      TAKE these medications       ibuprofen 600 MG tablet  Commonly known as:  ADVIL,MOTRIN  Take 1 tablet (600 mg total) by mouth every 6 (six) hours as needed for pain.     oxyCODONE-acetaminophen 5-325 MG per tablet  Commonly known as:  PERCOCET/ROXICET  Take 1-2 tablets by mouth every 4 (four) hours as needed for pain.            Follow-up Information   Follow up with [facility]. Call on 09/05/2012.   Contact information:    [address] [place] [address] [phone]        Terecia Plaut 09/04/2012, 4:40 PM

## 2012-09-04 NOTE — MAU Note (Signed)
Pt here last night for same RLQ pain. Told she had ans ovarian cyst. Given tylenol #3. Not relieving pain.

## 2012-09-04 NOTE — MAU Note (Signed)
Pt states that she was evaluated in MAU last night and was diagnosed with ovarian cyst. States that she was given tylenol #3 and has been taking 2 tablets every 3 to 4 hours and the pain has not subsided at all.

## 2012-09-13 ENCOUNTER — Other Ambulatory Visit: Payer: Self-pay | Admitting: Orthopaedic Surgery

## 2012-09-13 DIAGNOSIS — M545 Low back pain: Secondary | ICD-10-CM

## 2012-09-26 ENCOUNTER — Other Ambulatory Visit: Payer: Medicaid Other

## 2012-09-29 ENCOUNTER — Encounter (HOSPITAL_COMMUNITY): Payer: Self-pay | Admitting: *Deleted

## 2012-09-29 ENCOUNTER — Inpatient Hospital Stay (HOSPITAL_COMMUNITY)
Admission: AD | Admit: 2012-09-29 | Discharge: 2012-09-29 | Disposition: A | Discharge status: Home / Self Care | Payer: Self-pay | Source: Ambulatory Visit | Attending: Obstetrics & Gynecology | Admitting: Obstetrics & Gynecology

## 2012-09-29 ENCOUNTER — Inpatient Hospital Stay (HOSPITAL_COMMUNITY): Payer: Self-pay

## 2012-09-29 DIAGNOSIS — N83209 Unspecified ovarian cyst, unspecified side: Secondary | ICD-10-CM

## 2012-09-29 DIAGNOSIS — R1032 Left lower quadrant pain: Secondary | ICD-10-CM | POA: Insufficient documentation

## 2012-09-29 LAB — URINALYSIS, ROUTINE W REFLEX MICROSCOPIC
Nitrite: NEGATIVE
Specific Gravity, Urine: >1.03 — ABNORMAL HIGH (ref 1.005–1.030)
Urobilinogen, UA: 0.2 mg/dL (ref 0.0–1.0)

## 2012-09-29 LAB — POCT PREGNANCY, URINE: Preg Test, Ur: NEGATIVE

## 2012-09-29 MED ORDER — HYDROMORPHONE HCL PF 1 MG/ML IJ SOLN
1.0000 mg | Freq: Once | INTRAMUSCULAR | Status: AC
  Administered 2012-09-29: 1 mg via INTRAMUSCULAR
  Filled 2012-09-29: qty 1

## 2012-09-29 MED ORDER — OXYCODONE-ACETAMINOPHEN 5-325 MG PO TABS: 1.0000 | ORAL_TABLET | ORAL | Status: DC | PRN

## 2012-09-29 NOTE — Progress Notes (Signed)
Pt states pain is going up  Her lower back  As well

## 2012-09-29 NOTE — MAU Note (Signed)
Pt states she was having pain.pt states it was the same pain she had when "I was here before and yall found a cyst on her right side." Pt states she is having pain on the right side as well

## 2012-09-29 NOTE — MAU Note (Signed)
Like the pain she had when she was told she had a cyst, but now it is worse and radiating to back

## 2012-09-29 NOTE — MAU Provider Note (Signed)
History     CSN: [id number]  Arrival date and time: 09/29/12 1412   First Provider Initiated Contact with Patient 09/29/12 1556      Chief Complaint  Patient presents with  . Abdominal Pain   HPI [name] is a 42 y.o. [id number] who presents to MAU today with complaint of lower abdominal pain. The patient was here on 09/04/12 and had Korea that showed a right ovarian cyst ~ 6 cm. She states that the pain was well-controlled previously with Percocet, but she ran out. The ibuprofen does not provide relief. She states that the pain became worse today and rates her pain at 10/10 now. She has not taken any pain medication today. She has had nausea secondary to pain, without vomiting. She denies vaginal bleeding. She has a small amount of thin, clear vaginal discharge, but declines pelvic exam today. After last visit patient advised to follow-up with GYN at [facility]. Patient states that she no longer has insurance and they will not see her as a patient.   OB History   Grav Para Term Preterm Abortions TAB SAB Ect Mult Living   4 2 1 1 2 1 1   2       Past Medical History  Diagnosis Date  . Seizures     migraines    Past Surgical History  Procedure Laterality Date  . Cesarean section    . Cesarean section with bilateral tubal ligation Bilateral 05/17/2012    Procedure: CESAREAN SECTION WITH BILATERAL TUBAL LIGATION;  Surgeon: Mickel Baas, MD;  Location: [facility];  Service: Obstetrics;  Laterality: Bilateral;    Family History  Problem Relation Age of Onset  . Alcohol abuse Neg Hx   . Arthritis Neg Hx   . Asthma Neg Hx   . Birth defects Neg Hx   . Cancer Neg Hx   . COPD Neg Hx   . Depression Neg Hx   . Diabetes Neg Hx   . Drug abuse Neg Hx   . Early death Neg Hx   . Hearing loss Neg Hx   . Heart disease Neg Hx   . Hyperlipidemia Neg Hx   . Hypertension Neg Hx   . Kidney disease Neg Hx   . Learning disabilities Neg Hx   . Mental illness Neg Hx   . Mental retardation  Neg Hx   . Miscarriages / Stillbirths Neg Hx   . Stroke Neg Hx   . Vision loss Neg Hx     History  Substance Use Topics  . Smoking status: Current Every Day Smoker -- 0.25 packs/day    Types: Cigarettes  . Smokeless tobacco: Never Used  . Alcohol Use: No    Allergies: No Known Allergies  No prescriptions prior to admission    Review of Systems  Constitutional: Negative for fever and malaise/fatigue.  Gastrointestinal: Positive for nausea and abdominal pain. Negative for vomiting, diarrhea and constipation.  Genitourinary: Negative for dysuria, urgency, frequency, hematuria and flank pain.       Neg - vaginal bleeding + vaginal discharge  Musculoskeletal: Positive for back pain.   Physical Exam   Blood pressure 139/81, pulse 73, temperature 97.6 F (36.4 C), temperature source Oral, resp. rate 18, height 5\' 4"  (1.626 m), weight 275 lb 6 oz (124.909 kg), last menstrual period 09/01/2012, SpO2 97.00%.  Physical Exam  Constitutional: She is oriented to person, place, and time.  Well-developed, obese female who appears uncomfortable  HENT:  Head: Normocephalic and  atraumatic.  Cardiovascular: Normal rate, regular rhythm and normal heart sounds.   Respiratory: Effort normal and breath sounds normal. No respiratory distress.  GI: Soft. Bowel sounds are normal. She exhibits no distension and no mass. There is tenderness (Moderate tenderness to palpation of the lower abdomen more prominent on the LLQ). There is CVA tenderness (? mild left CVA tenderness). There is no rebound and no guarding.  Neurological: She is alert and oriented to person, place, and time.  Skin: Skin is warm and dry. No erythema.   Results for orders placed during the hospital encounter of 09/29/12 (from the past 24 hour(s))  URINALYSIS, ROUTINE W REFLEX MICROSCOPIC     Status: Abnormal   Collection Time    09/29/12  3:18 PM      Result Value Range   Color, Urine YELLOW  YELLOW   APPearance CLEAR  CLEAR    Specific Gravity, Urine >1.030 (*) 1.005 - 1.030   pH 6.0  5.0 - 8.0   Glucose, UA NEGATIVE  NEGATIVE mg/dL   Hgb urine dipstick NEGATIVE  NEGATIVE   Bilirubin Urine NEGATIVE  NEGATIVE   Ketones, ur NEGATIVE  NEGATIVE mg/dL   Protein, ur NEGATIVE  NEGATIVE mg/dL   Urobilinogen, UA 0.2  0.0 - 1.0 mg/dL   Nitrite NEGATIVE  NEGATIVE   Leukocytes, UA NEGATIVE  NEGATIVE  POCT PREGNANCY, URINE     Status: None   Collection Time    09/29/12  3:29 PM      Result Value Range   Preg Test, Ur NEGATIVE  NEGATIVE   US Transvaginal Non-ob  09/29/2012   *RADIOLOGY REPORT*  Clinical Data: Left lower quadrant pain.  Follow-up right ovarian cyst.  TRANSVAGINAL ULTRASOUND OF PELVIS  Technique:  Transvaginal ultrasound examination of the pelvis was performed including evaluation of the uterus, ovaries, adnexal regions, and pelvic cul-de-sac.  Comparison:  09/04/2012  Findings:  Uterus:  10.8 x 5.6 x 6.7 cm.  Normal echotexture.  No focal abnormality.  Endometrium: Normal appearance and thickness, 7 mm.  Right ovary: 4.6 x 2.8 x 3.2 cm.  Right ovarian cyst measures 2.9 x 1.4 x 1.3 cm.  This previously measured up to 6.7 cm.  Left ovary: 4.3 x 2.8 x 3.0 cm. Normal size and echotexture.  No adnexal masses.  Other Findings:  No free fluid.  IMPRESSION: Significant decreased size of the right ovarian cyst, now measuring 2.9 cm maximally.   Original Report Authenticated By: Charlett Nose, M.D.   US Renal  09/29/2012   *RADIOLOGY REPORT*  Clinical Data: Left lower quadrant pain, flank pain.  RENAL/URINARY TRACT ULTRASOUND COMPLETE  Comparison:  None.  Findings:  Right Kidney:  11.8 cm. Normal size and echotexture.  No focal abnormality.  No hydronephrosis.  Left Kidney:  12.1 cm.  Mildly lobulated contours.  No focal abnormality.  Normal echotexture.  No hydronephrosis.  Bladder:  Normal appearance.  IMPRESSION: Unremarkable study.   Original Report Authenticated By: Charlett Nose, M.D.    MAU Course   Procedures None  MDM Resolving cyst noted on Korea. Renal US normal. Follow-up in clinic if symptoms persist. Patient encouraged to use Percocet only PRN and attempt to use ibuprofen when possible for pain control, although not more than recommended dose.   Assessment and Plan  A: Ovarian cyst  P: Discharge home Patient referred to [facility] clinic for follow-up in ~ 3-4 weeks Rx for Percocet given to patient. Patient advised to use ibuprofen PRN for breakthrough pain Patient may return  to MAU as needed or if her condition were to change or worsen  Freddi Starr, PA-C  09/29/2012, 7:51 PM

## 2012-12-05 ENCOUNTER — Ambulatory Visit: Payer: Self-pay

## 2013-12-25 ENCOUNTER — Encounter (HOSPITAL_COMMUNITY): Payer: Self-pay | Admitting: *Deleted

## 2014-05-02 ENCOUNTER — Emergency Department (HOSPITAL_COMMUNITY)
Admission: EM | Admit: 2014-05-02 | Discharge: 2014-05-02 | Disposition: A | Discharge status: Home / Self Care | Payer: Medicaid Other | Attending: Emergency Medicine | Admitting: Emergency Medicine

## 2014-05-02 ENCOUNTER — Emergency Department (HOSPITAL_COMMUNITY): Payer: Medicaid Other

## 2014-05-02 ENCOUNTER — Encounter (HOSPITAL_COMMUNITY): Payer: Self-pay | Admitting: *Deleted

## 2014-05-02 DIAGNOSIS — J40 Bronchitis, not specified as acute or chronic: Secondary | ICD-10-CM | POA: Diagnosis not present

## 2014-05-02 DIAGNOSIS — J069 Acute upper respiratory infection, unspecified: Secondary | ICD-10-CM | POA: Diagnosis not present

## 2014-05-02 DIAGNOSIS — Z720 Tobacco use: Secondary | ICD-10-CM | POA: Diagnosis not present

## 2014-05-02 DIAGNOSIS — R05 Cough: Secondary | ICD-10-CM

## 2014-05-02 DIAGNOSIS — E669 Obesity, unspecified: Secondary | ICD-10-CM | POA: Diagnosis not present

## 2014-05-02 DIAGNOSIS — J029 Acute pharyngitis, unspecified: Secondary | ICD-10-CM | POA: Diagnosis not present

## 2014-05-02 DIAGNOSIS — R059 Cough, unspecified: Secondary | ICD-10-CM

## 2014-05-02 HISTORY — DX: Obesity, unspecified: E66.9

## 2014-05-02 LAB — RAPID STREP SCREEN (MED CTR MEBANE ONLY): Streptococcus, Group A Screen (Direct): NEGATIVE

## 2014-05-02 MED ORDER — DM-GUAIFENESIN ER 30-600 MG PO TB12: 1.0000 | ORAL_TABLET | Freq: Two times a day (BID) | ORAL | Status: DC

## 2014-05-02 MED ORDER — NAPROXEN 500 MG PO TABS: 500.0000 mg | ORAL_TABLET | Freq: Two times a day (BID) | ORAL | Status: DC

## 2014-05-02 NOTE — Discharge Instructions (Signed)
Workup negative for pneumonia negative for strep pharyngitis. Throat culture is pending it is possible it could be positive over the next couple days she'll be contacted if it is. Otherwise this is a viral upper respiratory infection with bronchitis. Take Mucinex DM as directed. Take the Naprosyn which is anti-inflammatory will help settle down throat some as well.

## 2014-05-02 NOTE — ED Notes (Signed)
Pt transported to xray 

## 2014-05-02 NOTE — ED Notes (Signed)
Pt reports recent cough and cold symptoms x 1 month, has productive cough with green sputum. Reports chest pain when coughing and breathing. Airway intact.

## 2014-05-02 NOTE — ED Provider Notes (Addendum)
CSN: [id number]     Arrival date & time 05/02/14  7829 History   First MD Initiated Contact with Patient 05/02/14 [phone]     Chief Complaint  Patient presents with  . Cough     (Consider location/radiation/quality/duration/timing/severity/associated sxs/prior Treatment) Patient is a 44 y.o. female presenting with cough. The history is provided by the patient.  Cough Cough characteristics:  Productive Associated symptoms: chest pain and sore throat   Associated symptoms: no fever, no headaches, no rash and no shortness of breath    patient with history of cough for about a month. But didn't past 3 days started with a new sore throat now cough more productive with green sputum. Sounds like An additional new onset of upper respiratory infection. Patient also has sore throat. Patient does not have a history of strep in the past. Patient denies fevers.  Past Medical History  Diagnosis Date  . Seizures     migraines  . Obesity    Past Surgical History  Procedure Laterality Date  . Cesarean section    . Cesarean section with bilateral tubal ligation Bilateral 05/17/2012    Procedure: CESAREAN SECTION WITH BILATERAL TUBAL LIGATION;  Surgeon: Mickel Baas, MD;  Location: [facility];  Service: Obstetrics;  Laterality: Bilateral;   Family History  Problem Relation Age of Onset  . Alcohol abuse Neg Hx   . Arthritis Neg Hx   . Asthma Neg Hx   . Birth defects Neg Hx   . Cancer Neg Hx   . COPD Neg Hx   . Depression Neg Hx   . Diabetes Neg Hx   . Drug abuse Neg Hx   . Early death Neg Hx   . Hearing loss Neg Hx   . Heart disease Neg Hx   . Hyperlipidemia Neg Hx   . Hypertension Neg Hx   . Kidney disease Neg Hx   . Learning disabilities Neg Hx   . Mental illness Neg Hx   . Mental retardation Neg Hx   . Miscarriages / Stillbirths Neg Hx   . Stroke Neg Hx   . Vision loss Neg Hx    History  Substance Use Topics  . Smoking status: Current Every Day Smoker -- 0.25 packs/day    Types:  Cigarettes  . Smokeless tobacco: Never Used  . Alcohol Use: No   OB History    Gravida Para Term Preterm AB TAB SAB Ectopic Multiple Living   Review of Systems  Constitutional: Negative for fever.  HENT: Positive for congestion and sore throat.   Eyes: Negative for redness.  Respiratory: Positive for cough. Negative for shortness of breath.   Cardiovascular: Positive for chest pain.  Gastrointestinal: Negative for abdominal pain.  Genitourinary: Negative for dysuria.  Musculoskeletal: Negative for back pain.  Skin: Negative for rash.  Neurological: Negative for headaches.  Hematological: Does not bruise/bleed easily.  Psychiatric/Behavioral: Negative for confusion.      Allergies  Review of patient's allergies indicates no known allergies.  Home Medications   Prior to Admission medications   Medication Sig Start Date End Date Taking? Authorizing Provider  dextromethorphan-guaiFENesin (MUCINEX DM) 30-600 MG per 12 hr tablet Take 1 tablet by mouth 2 (two) times daily. 05/02/14   Vanetta Mulders, MD  naproxen (NAPROSYN) 500 MG tablet Take 1 tablet (500 mg total) by mouth 2 (two) times daily. 05/02/14   Vanetta Mulders, MD  oxyCODONE-acetaminophen (PERCOCET/ROXICET) [phone]  MG per tablet Take 1-2 tablets by mouth every 4 (four) hours as needed for pain. 09/29/12   Marny LowensteinJulie N Wenzel, PA-C   BP 119/76 mmHg  Pulse 78  Temp(Src) 97.7 F (36.5 C) (Oral)  Resp 15  SpO2 99%  LMP 04/09/2014 Physical Exam  Constitutional: She is oriented to person, place, and time. She appears well-developed and well-nourished.  HENT:  Head: Normocephalic and atraumatic.  Mouth/Throat: Oropharynx is clear and moist. No oropharyngeal exudate.  Redness to the back of the throat no exudate uvula midline.  Eyes: Conjunctivae and EOM are normal. Pupils are equal, round, and reactive to light.  Neck: Normal range of motion.  Cardiovascular: Normal rate, regular rhythm and normal heart  sounds.   No murmur heard. Pulmonary/Chest: Effort normal and breath sounds normal. No respiratory distress. She has no wheezes.  Abdominal: Soft. Bowel sounds are normal. There is no tenderness.  Musculoskeletal: Normal range of motion.  Neurological: She is alert and oriented to person, place, and time. No cranial nerve deficit. She exhibits normal muscle tone. Coordination normal.  Skin: Skin is warm. No rash noted.  Nursing note and vitals reviewed.   ED Course  Procedures (including critical care time) Labs Review Labs Reviewed  RAPID STREP SCREEN  CULTURE, GROUP A STREP   Results for orders placed or performed during the hospital encounter of 05/02/14  Rapid strep screen  Result Value Ref Range   Streptococcus, Group A Screen (Direct) NEGATIVE NEGATIVE    Imaging Review Dg Chest 2 View  05/02/2014   CLINICAL DATA:  Cough, congestion, productive cough  EXAM: CHEST  2 VIEW  COMPARISON:  10/23/2010  FINDINGS: Cardiomediastinal silhouette is stable. No acute infiltrate or pleural effusion. No pulmonary edema. Bony thorax is unremarkable.  IMPRESSION: No active cardiopulmonary disease.   Electronically Signed   By: Natasha MeadLiviu  Pop M.D.   On: 05/02/2014 10:11     EKG Interpretation   Date/Time:  Wednesday May 02 2014 09:45:52 EST Ventricular Rate:  80 PR Interval:  178 QRS Duration: 84 QT Interval:  379 QTC Calculation: 437 R Axis:   67 Text Interpretation:  Sinus rhythm Baseline wander in lead(s) V5 No  previous ECGs available Confirmed by Fiona Coto  MD, Muhammed Teutsch ([id number]) on  05/02/2014 9:50:06 AM      MDM   Final diagnoses:  Cough  URI (upper respiratory infection)  Pharyngitis  Bronchitis    Patient symptoms consistent with a recurrent upper respiratory infection in the past few days. With a pre-existing persisting cough from a previous upper respiratory infection. Chest x-rays negative for pneumonia. Patient now with pharyngitis and worsening cough for the past 3 days.  Cough is productive green sputum. Insistent with a bronchitis and pharyngitis. Rapid strep pending to rule out strep pharyngitis. Patient nontoxic no acute distress.  Rapid strep negative. Chest x-ray negative for pneumonia. Symptoms consistent with a recurrent viral upper respiratory infection  Vanetta MuldersScott Torres Hardenbrook, MD 05/02/14 1040  Vanetta MuldersScott Marshella Tello, MD 05/02/14 1203  Vanetta MuldersScott Keyuna Cuthrell, MD 05/02/14 1205

## 2014-05-04 LAB — CULTURE, GROUP A STREP: Strep A Culture: NEGATIVE

## 2014-05-29 ENCOUNTER — Observation Stay (HOSPITAL_COMMUNITY)
Admission: EM | Admit: 2014-05-29 | Discharge: 2014-05-31 | Disposition: A | Discharge status: Home / Self Care | Payer: Medicaid Other | Attending: Surgery | Admitting: Surgery

## 2014-05-29 ENCOUNTER — Emergency Department (HOSPITAL_COMMUNITY): Payer: Medicaid Other

## 2014-05-29 ENCOUNTER — Encounter (HOSPITAL_COMMUNITY): Payer: Self-pay | Admitting: General Surgery

## 2014-05-29 DIAGNOSIS — Z6841 Body Mass Index (BMI) 40.0 and over, adult: Secondary | ICD-10-CM | POA: Insufficient documentation

## 2014-05-29 DIAGNOSIS — K819 Cholecystitis, unspecified: Secondary | ICD-10-CM

## 2014-05-29 DIAGNOSIS — F17210 Nicotine dependence, cigarettes, uncomplicated: Secondary | ICD-10-CM | POA: Insufficient documentation

## 2014-05-29 DIAGNOSIS — G43909 Migraine, unspecified, not intractable, without status migrainosus: Secondary | ICD-10-CM | POA: Insufficient documentation

## 2014-05-29 DIAGNOSIS — K8010 Calculus of gallbladder with chronic cholecystitis without obstruction: Principal | ICD-10-CM | POA: Insufficient documentation

## 2014-05-29 DIAGNOSIS — Z419 Encounter for procedure for purposes other than remedying health state, unspecified: Secondary | ICD-10-CM

## 2014-05-29 DIAGNOSIS — R109 Unspecified abdominal pain: Secondary | ICD-10-CM | POA: Diagnosis present

## 2014-05-29 LAB — URINALYSIS, ROUTINE W REFLEX MICROSCOPIC
BILIRUBIN URINE: NEGATIVE
Glucose, UA: NEGATIVE mg/dL
HGB URINE DIPSTICK: NEGATIVE
KETONES UR: NEGATIVE mg/dL
Leukocytes, UA: NEGATIVE
Nitrite: NEGATIVE
PH: 8.5 — AB (ref 5.0–8.0)
Protein, ur: NEGATIVE mg/dL
SPECIFIC GRAVITY, URINE: 1.023 (ref 1.005–1.030)
Urobilinogen, UA: 0.2 mg/dL (ref 0.0–1.0)

## 2014-05-29 LAB — COMPREHENSIVE METABOLIC PANEL
ALT: 14 U/L (ref 0–35)
AST: 19 U/L (ref 0–37)
Albumin: 3.5 g/dL (ref 3.5–5.2)
Alkaline Phosphatase: 100 U/L (ref 39–117)
Anion gap: 3 — ABNORMAL LOW (ref 5–15)
BUN: 8 mg/dL (ref 6–23)
CALCIUM: 8.8 mg/dL (ref 8.4–10.5)
CO2: 30 mmol/L (ref 19–32)
CREATININE: 0.92 mg/dL (ref 0.50–1.10)
Chloride: 105 mmol/L (ref 96–112)
GFR calc non Af Amer: 76 mL/min — ABNORMAL LOW (ref >90)
GFR, EST AFRICAN AMERICAN: 88 mL/min — AB (ref >90)
GLUCOSE: 90 mg/dL (ref 70–99)
Potassium: 3.9 mmol/L (ref 3.5–5.1)
Sodium: 138 mmol/L (ref 135–145)
TOTAL PROTEIN: 7.6 g/dL (ref 6.0–8.3)
Total Bilirubin: 0.9 mg/dL (ref 0.3–1.2)

## 2014-05-29 LAB — CBC WITH DIFFERENTIAL/PLATELET
Basophils Absolute: 0 10*3/uL (ref 0.0–0.1)
Basophils Relative: 0 % (ref 0–1)
EOS ABS: 0.1 10*3/uL (ref 0.0–0.7)
EOS PCT: 1 % (ref 0–5)
HCT: 41.3 % (ref 36.0–46.0)
Hemoglobin: 13.2 g/dL (ref 12.0–15.0)
Lymphocytes Relative: 33 % (ref 12–46)
Lymphs Abs: 2.8 10*3/uL (ref 0.7–4.0)
MCH: 29 pg (ref 26.0–34.0)
MCHC: 32 g/dL (ref 30.0–36.0)
MCV: 90.8 fL (ref 78.0–100.0)
Monocytes Absolute: 0.6 10*3/uL (ref 0.1–1.0)
Monocytes Relative: 7 % (ref 3–12)
Neutro Abs: 5 10*3/uL (ref 1.7–7.7)
Neutrophils Relative %: 59 % (ref 43–77)
PLATELETS: 232 10*3/uL (ref 150–400)
RBC: 4.55 MIL/uL (ref 3.87–5.11)
RDW: 14.8 % (ref 11.5–15.5)
WBC: 8.5 10*3/uL (ref 4.0–10.5)

## 2014-05-29 LAB — POC URINE PREG, ED: Preg Test, Ur: NEGATIVE

## 2014-05-29 LAB — SURGICAL PCR SCREEN
MRSA, PCR: NEGATIVE
STAPHYLOCOCCUS AUREUS: NEGATIVE

## 2014-05-29 LAB — LIPASE, BLOOD: LIPASE: 22 U/L (ref 11–59)

## 2014-05-29 MED ORDER — ACETAMINOPHEN 325 MG PO TABS: 650.0000 mg | ORAL_TABLET | Freq: Four times a day (QID) | ORAL | Status: DC | PRN

## 2014-05-29 MED ORDER — PANTOPRAZOLE SODIUM 40 MG PO TBEC
40.0000 mg | DELAYED_RELEASE_TABLET | Freq: Every day | ORAL | Status: DC
  Administered 2014-05-31: 40 mg via ORAL
  Filled 2014-05-29: qty 1

## 2014-05-29 MED ORDER — OXYCODONE HCL 5 MG PO TABS
5.0000 mg | ORAL_TABLET | ORAL | Status: DC | PRN
  Administered 2014-05-30 – 2014-05-31 (×5): 10 mg via ORAL
  Filled 2014-05-29 (×5): qty 2

## 2014-05-29 MED ORDER — KCL IN DEXTROSE-NACL 20-5-0.45 MEQ/L-%-% IV SOLN
INTRAVENOUS | Status: DC
  Administered 2014-05-29 – 2014-05-31 (×4): via INTRAVENOUS
  Filled 2014-05-29 (×10): qty 1000

## 2014-05-29 MED ORDER — ONDANSETRON HCL 4 MG/2ML IJ SOLN
4.0000 mg | Freq: Once | INTRAMUSCULAR | Status: AC
  Administered 2014-05-29: 4 mg via INTRAVENOUS
  Filled 2014-05-29: qty 2

## 2014-05-29 MED ORDER — MORPHINE SULFATE 2 MG/ML IJ SOLN
1.0000 mg | INTRAMUSCULAR | Status: DC | PRN
  Filled 2014-05-29: qty 2

## 2014-05-29 MED ORDER — ONDANSETRON HCL 4 MG/2ML IJ SOLN
4.0000 mg | INTRAMUSCULAR | Status: DC | PRN
  Administered 2014-05-30 (×2): 4 mg via INTRAVENOUS
  Filled 2014-05-29 (×3): qty 2

## 2014-05-29 MED ORDER — DIPHENHYDRAMINE HCL 50 MG/ML IJ SOLN: 12.5000 mg | Freq: Four times a day (QID) | INTRAMUSCULAR | Status: DC | PRN

## 2014-05-29 MED ORDER — SODIUM CHLORIDE 0.9 % IV SOLN
INTRAVENOUS | Status: DC
  Administered 2014-05-29 – 2014-05-30 (×2): via INTRAVENOUS

## 2014-05-29 MED ORDER — FENTANYL CITRATE 0.05 MG/ML IJ SOLN
50.0000 ug | INTRAMUSCULAR | Status: DC | PRN
  Administered 2014-05-29 – 2014-05-30 (×5): 100 ug via INTRAVENOUS
  Filled 2014-05-29 (×5): qty 2

## 2014-05-29 MED ORDER — SODIUM CHLORIDE 0.9 % IV BOLUS (SEPSIS)
1000.0000 mL | Freq: Once | INTRAVENOUS | Status: AC
  Administered 2014-05-29: 1000 mL via INTRAVENOUS

## 2014-05-29 MED ORDER — DEXTROSE 5 % IV SOLN
2.0000 g | INTRAVENOUS | Status: DC
  Administered 2014-05-29 – 2014-05-30 (×2): 2 g via INTRAVENOUS
  Filled 2014-05-29 (×3): qty 2

## 2014-05-29 MED ORDER — ACETAMINOPHEN 650 MG RE SUPP: 650.0000 mg | Freq: Four times a day (QID) | RECTAL | Status: DC | PRN

## 2014-05-29 MED ORDER — FENTANYL CITRATE 0.05 MG/ML IJ SOLN
50.0000 ug | Freq: Once | INTRAMUSCULAR | Status: AC
  Administered 2014-05-29: 50 ug via INTRAVENOUS
  Filled 2014-05-29: qty 2

## 2014-05-29 MED ORDER — MORPHINE SULFATE 4 MG/ML IJ SOLN
4.0000 mg | Freq: Once | INTRAMUSCULAR | Status: AC
  Administered 2014-05-29: 4 mg via INTRAVENOUS
  Filled 2014-05-29: qty 1

## 2014-05-29 MED ORDER — DIPHENHYDRAMINE HCL 12.5 MG/5ML PO ELIX: 12.5000 mg | ORAL_SOLUTION | Freq: Four times a day (QID) | ORAL | Status: DC | PRN

## 2014-05-29 NOTE — H&P (Addendum)
Madeline Silva [birth year]  [id number].   Chief Complaint/Reason for Consult: acalculous cholecystitis HPI: This is a 44 yo morbidly obese black female who ate steak for supper and then ice cream around 10pm.  She awoke at 0100am with acute severe epigastric and RUQ abdominal pain.  She has never had pain like this before.  She has a lot of nausea and vomiting, but no diarrhea.  She denies fevers. Her pain persisted so she came to [facility].  She admits to some reflux, but denies any significant use of NSAIDs.  She had an Korea here that revealed wall thickening of her gallbladder, but no gallstones.  She did have a + Murphy's sign.  Her labs were all normal.  We have been asked to see her for admission.  ROS: Please see HPI, otherwise all other systems are negative  Family History  Problem Relation Age of Onset  . Alcohol abuse Neg Hx   . Arthritis Neg Hx   . Asthma Neg Hx   . Birth defects Neg Hx   . Cancer Neg Hx   . COPD Neg Hx   . Depression Neg Hx   . Diabetes Neg Hx   . Drug abuse Neg Hx   . Early death Neg Hx   . Hearing loss Neg Hx   . Heart disease Neg Hx   . Hyperlipidemia Neg Hx   . Hypertension Neg Hx   . Kidney disease Neg Hx   . Learning disabilities Neg Hx   . Mental illness Neg Hx   . Mental retardation Neg Hx   . Miscarriages / Stillbirths Neg Hx   . Stroke Neg Hx   . Vision loss Neg Hx     Past Medical History  Diagnosis Date  . Seizures     migraines  . Obesity     Past Surgical History  Procedure Laterality Date  . Cesarean section    . Cesarean section with bilateral tubal ligation Bilateral 05/17/2012    Procedure: CESAREAN SECTION WITH BILATERAL TUBAL LIGATION;  Surgeon: Sharene Butters, MD;  Location: [facility];  Service: Obstetrics;  Laterality: Bilateral;    Social History:  reports that she has been smoking Cigarettes.  She has been smoking about 0.25 packs per day. She has never used smokeless tobacco. She reports that she does not drink alcohol or use  illicit drugs.  Allergies: No Known Allergies   (Not in a hospital admission)  Blood pressure 116/78, pulse 79, temperature 98 F (36.7 C), temperature source Oral, resp. rate 18, height $RemoveBe'5\' 5"'JEOgbfTJl$  (1.651 m), weight 127.007 kg (280 lb), last menstrual period 05/23/2014, SpO2 100 %. Physical Exam: General: pleasant, obese, black female who is laying in bed in NAD HEENT: head is normocephalic, atraumatic.  Sclera are noninjected.  PERRL.  Ears and nose without any masses or lesions.  Mouth is pink and moist Heart: regular, rate, and rhythm.  Normal s1,s2. No obvious murmurs, gallops, or rubs noted.  Palpable radial and pedal pulses bilaterally Lungs: CTAB, no wheezes, rhonchi, or rales noted.  Respiratory effort nonlabored Abd: soft, tender greatest in RUQ and some in epigastrium , ND, +BS, no masses, hernias, or organomegaly, obese MS: all 4 extremities are symmetrical with no cyanosis, clubbing, or edema. Skin: warm and dry with no masses, lesions, or rashes Psych: A&Ox3 with an appropriate affect.    Results for orders placed or performed during the hospital encounter of 05/29/14 (from the past 48 hour(s))  Urinalysis, Routine w reflex microscopic  Status: Abnormal   Collection Time: 05/29/14  9:23 AM  Result Value Ref Range   Color, Urine YELLOW YELLOW   APPearance CLOUDY (A) CLEAR   Specific Gravity, Urine 1.023 1.005 - 1.030   pH 8.5 (H) 5.0 - 8.0   Glucose, UA NEGATIVE NEGATIVE mg/dL   Hgb urine dipstick NEGATIVE NEGATIVE   Bilirubin Urine NEGATIVE NEGATIVE   Ketones, ur NEGATIVE NEGATIVE mg/dL   Protein, ur NEGATIVE NEGATIVE mg/dL   Urobilinogen, UA 0.2 0.0 - 1.0 mg/dL   Nitrite NEGATIVE NEGATIVE   Leukocytes, UA NEGATIVE NEGATIVE    Comment: MICROSCOPIC NOT DONE ON URINES WITH NEGATIVE PROTEIN, BLOOD, LEUKOCYTES, NITRITE, OR GLUCOSE <1000 mg/dL.  CBC with Differential     Status: None   Collection Time: 05/29/14  9:30 AM  Result Value Ref Range   WBC 8.5 4.0 - 10.5 K/uL    RBC 4.55 3.87 - 5.11 MIL/uL   Hemoglobin 13.2 12.0 - 15.0 g/dL   HCT 41.3 36.0 - 46.0 %   MCV 90.8 78.0 - 100.0 fL   MCH 29.0 26.0 - 34.0 pg   MCHC 32.0 30.0 - 36.0 g/dL   RDW 14.8 11.5 - 15.5 %   Platelets 232 150 - 400 K/uL   Neutrophils Relative % 59 43 - 77 %   Neutro Abs 5.0 1.7 - 7.7 K/uL   Lymphocytes Relative 33 12 - 46 %   Lymphs Abs 2.8 0.7 - 4.0 K/uL   Monocytes Relative 7 3 - 12 %   Monocytes Absolute 0.6 0.1 - 1.0 K/uL   Eosinophils Relative 1 0 - 5 %   Eosinophils Absolute 0.1 0.0 - 0.7 K/uL   Basophils Relative 0 0 - 1 %   Basophils Absolute 0.0 0.0 - 0.1 K/uL  Comprehensive metabolic panel     Status: Abnormal   Collection Time: 05/29/14  9:30 AM  Result Value Ref Range   Sodium 138 135 - 145 mmol/L   Potassium 3.9 3.5 - 5.1 mmol/L   Chloride 105 96 - 112 mmol/L   CO2 30 19 - 32 mmol/L   Glucose, Bld 90 70 - 99 mg/dL   BUN 8 6 - 23 mg/dL   Creatinine, Ser 0.92 0.50 - 1.10 mg/dL   Calcium 8.8 8.4 - 10.5 mg/dL   Total Protein 7.6 6.0 - 8.3 g/dL   Albumin 3.5 3.5 - 5.2 g/dL   AST 19 0 - 37 U/L   ALT 14 0 - 35 U/L   Alkaline Phosphatase 100 39 - 117 U/L   Total Bilirubin 0.9 0.3 - 1.2 mg/dL   GFR calc non Af Amer 76 (L) >90 mL/min   GFR calc Af Amer 88 (L) >90 mL/min    Comment: (NOTE) The eGFR has been calculated using the CKD EPI equation. This calculation has not been validated in all clinical situations. eGFR's persistently <90 mL/min signify possible Chronic Kidney Disease.    Anion gap 3 (L) 5 - 15  Lipase, blood     Status: None   Collection Time: 05/29/14  9:30 AM  Result Value Ref Range   Lipase 22 11 - 59 U/L  POC urine preg, ED (not at [facility])     Status: None   Collection Time: 05/29/14  9:45 AM  Result Value Ref Range   Preg Test, Ur NEGATIVE NEGATIVE    Comment:        THE SENSITIVITY OF THIS METHODOLOGY IS >24 mIU/mL    US Abdomen Complete  05/29/2014  CLINICAL DATA:  Epigastric pain starting last night  EXAM: ULTRASOUND ABDOMEN  COMPLETE  COMPARISON:  None.  FINDINGS: Gallbladder: No shadowing gallstones are noted within gallbladder. There is thickening of gallbladder wall up to 4.5 mm. Positive sonographic Murphy sign. Clinical correlation is necessary to exclude early cholecystitis.  Common bile duct: Diameter: 1.8 mm in diameter within normal limits.  Liver: No focal lesion identified. Diffuse increased echogenicity of the liver suspicious for fatty infiltration.  IVC: No abnormality visualized.  Pancreas: Visualized portion unremarkable. Pancreatic tail is not visualized.  Spleen: Size and appearance within normal limits. Measures 4.2 cm in length.  Right Kidney: Length: 9.9 cm. Echogenicity within normal limits. No mass or hydronephrosis visualized.  Left Kidney: Length: 10.2 cm. Echogenicity within normal limits. No mass or hydronephrosis visualized.  Abdominal aorta: No aneurysm visualized. Measures up to 2.4 cm in diameter.  Other findings: None.  IMPRESSION: 1. No shadowing gallstones are noted within gallbladder. Mild thickening of gallbladder wall and positive sonographic Murphy sign. Clinical correlation is necessary to exclude early cholecystitis. 2. Normal CBD. 3. Probable fatty infiltration of the liver. 4. No hydronephrosis.   Electronically Signed   By: Lahoma Crocker M.D.   On: 05/29/2014 11:17      Assessment/Plan 1. Abdominal pain, likely acalculous cholecystitis  -admit, IVFs, IV Rocephin.  -even though the patient does not have gallstones, her story and physical exam are c/w acalculous cholecystitis.  I am concerned the patient would not do well with outpatient follow up and elective surgery as she is still requiring morphine here to control her pain.  I have informed her there is a small chance that we could take her gallbladder out and it not resolve her symptoms, but I think this is unlikely.  She is agreeable and would like to proceed with surgical intervention.  This will be later today or  tomorrow.   OSBORNE,KELLY E 05/29/2014, 2:01 PM Pager: [phone]  Agree with above.  Signs, symptoms, and labs consistent with gall bladder disease. Plan cholecystectomy tomorrow.  I discussed with the patient the indications and risks of gall bladder surgery.  The primary risks of gall bladder surgery include, but are not limited to, bleeding, infection, common bile duct injury, and open surgery.  There is also the risk that the patient may have continued symptoms after surgery.  However, the likelihood of improvement in symptoms and return to the patient's normal status is good. We discussed the typical post-operative recovery course. I tried to answer the patient's questions.  I think that she has a good understanding of the problems  Alphonsa Overall, MD, [facility] Surgery Pager: [phone] Office phone:  [phone]

## 2014-05-29 NOTE — ED Provider Notes (Signed)
TIME SEEN: 8:40 AM  CHIEF COMPLAINT: abdominal pain, vomiting  HPI: Pt is a 44 y.o. female with history of migraines, obesity who presents to the emergency department with diffuse upper abdominal pain that she describes as sharp and I'll that is better after vomiting but then quickly returns it started at 11:30 PM last night. Reports that she was feeling well previously. Reports she ate ice cream at 10 PM. States she's had multiple episodes of nonbloody, nonbilious vomiting. No diarrhea. No bloody stool or melena. She has had dysuria but no hematuria. Last menstrual period was 2 weeks ago. Her only prior abdominal surgery was a C-section. Denies sick contacts or recent travel. Denies prior history of known gallstones.denies fever.  ROS: See HPI Constitutional: no fever  Eyes: no drainage  ENT: no runny nose   Cardiovascular:  no chest pain  Resp: no SOB  GI:  vomiting GU: no dysuria Integumentary: no rash  Allergy: no hives  Musculoskeletal: no leg swelling  Neurological: no slurred speech ROS otherwise negative  PAST MEDICAL HISTORY/PAST SURGICAL HISTORY:  Past Medical History  Diagnosis Date  . Seizures     migraines  . Obesity     MEDICATIONS:  Prior to Admission medications   Medication Sig Start Date End Date Taking? Authorizing Provider  dextromethorphan-guaiFENesin (MUCINEX DM) 30-600 MG per 12 hr tablet Take 1 tablet by mouth 2 (two) times daily. 05/02/14   Vanetta MuldersScott Zackowski, MD  naproxen (NAPROSYN) 500 MG tablet Take 1 tablet (500 mg total) by mouth 2 (two) times daily. 05/02/14   Vanetta MuldersScott Zackowski, MD  oxyCODONE-acetaminophen (PERCOCET/ROXICET) 5-325 MG per tablet Take 1-2 tablets by mouth every 4 (four) hours as needed for pain. 09/29/12   Marny LowensteinJulie N Wenzel, PA-C    ALLERGIES:  No Known Allergies  SOCIAL HISTORY:  History  Substance Use Topics  . Smoking status: Current Every Day Smoker -- 0.25 packs/day    Types: Cigarettes  . Smokeless tobacco: Never Used  . Alcohol Use:  No    FAMILY HISTORY: Family History  Problem Relation Age of Onset  . Alcohol abuse Neg Hx   . Arthritis Neg Hx   . Asthma Neg Hx   . Birth defects Neg Hx   . Cancer Neg Hx   . COPD Neg Hx   . Depression Neg Hx   . Diabetes Neg Hx   . Drug abuse Neg Hx   . Early death Neg Hx   . Hearing loss Neg Hx   . Heart disease Neg Hx   . Hyperlipidemia Neg Hx   . Hypertension Neg Hx   . Kidney disease Neg Hx   . Learning disabilities Neg Hx   . Mental illness Neg Hx   . Mental retardation Neg Hx   . Miscarriages / Stillbirths Neg Hx   . Stroke Neg Hx   . Vision loss Neg Hx     EXAM: BP 135/85 mmHg  Pulse 86  Temp(Src) 98 F (36.7 C) (Oral)  Resp 18  Ht 5\' 5"  (1.651 m)  Wt 280 lb (127.007 kg)  BMI 46.59 kg/m2  SpO2 99%  LMP 05/23/2014 CONSTITUTIONAL: Alert and oriented and responds appropriately to questions. Well-appearing; well-nourished HEAD: Normocephalic EYES: Conjunctivae clear, PERRL ENT: normal nose; no rhinorrhea; moist mucous membranes; pharynx without lesions noted NECK: Supple, no meningismus, no LAD  CARD: RRR; S1 and S2 appreciated; no murmurs, no clicks, no rubs, no gallops RESP: Normal chest excursion without splinting or tachypnea; breath sounds clear and equal  bilaterally; no wheezes, no rhonchi, no rales,  ABD/GI: Normal bowel sounds; non-distended; soft, tender to palpation throughout the upper abdomen, no guarding or rebound, no peritoneal signs, positive Murphy sign, no tenderness at McBurney's point BACK:  The back appears normal and is non-tender to palpation, there is no CVA tenderness EXT: Normal ROM in all joints; non-tender to palpation; no edema; normal capillary refill; no cyanosis    SKIN: Normal color for age and race; warm NEURO: Moves all extremities equally PSYCH: The patient's mood and manner are appropriate. Grooming and personal hygiene are appropriate.  MEDICAL DECISION MAKING: Patient here with diffuse upper abdominal pain, vomiting.  Differential diagnosis includes viral gastritis, cholecystitis, cholelithiasis, pancreatitis. Doubt appendicitis given no tenderness in the right lower quadrant. Will obtain abdominal labs, urine. Will obtain a abdominal ultrasound. We'll give IV fluids, Zofran, morphine.  ED PROGRESS: Patient's labs are reassuring. No leukocytosis. Normal LFTs and lipase. Urine shows no sign of infection and pregnancy test negative. Patient reports feeling better after morphine and signal. She is still very tender to palpation in her right upper quadrant.  Her abdominal ultrasound shows no gallstones but there is gallbladder wall thickening with a positive sonographic Murphy sign. We'll discuss with surgery on call.   2:00 PM  Surgery will admit patient and likely take to the operating room today or tomorrow.  Madeline Maw Darvin Dials, DO 05/29/14 1401

## 2014-05-29 NOTE — ED Notes (Signed)
Pt reports epigastric pain since 2300 last night that then radiates into lower abdomen and diffuse. Sharp then dull. Has gallbladder and appendix. Vomiting since last night. No diarr.

## 2014-05-29 NOTE — ED Notes (Signed)
Attempted report to 6N 

## 2014-05-29 NOTE — Progress Notes (Signed)
Patient present on floor. Report received from GageNicole, [place]. Patient stable, alert and orientedx4.

## 2014-05-30 ENCOUNTER — Observation Stay (HOSPITAL_COMMUNITY): Payer: Medicaid Other | Admitting: Anesthesiology

## 2014-05-30 ENCOUNTER — Observation Stay (HOSPITAL_COMMUNITY): Payer: Medicaid Other

## 2014-05-30 ENCOUNTER — Encounter (HOSPITAL_COMMUNITY)
Admission: EM | Disposition: A | Discharge status: Home / Self Care | Payer: Self-pay | Source: Home / Self Care | Attending: Emergency Medicine

## 2014-05-30 DIAGNOSIS — K8010 Calculus of gallbladder with chronic cholecystitis without obstruction: Secondary | ICD-10-CM | POA: Diagnosis not present

## 2014-05-30 DIAGNOSIS — G43909 Migraine, unspecified, not intractable, without status migrainosus: Secondary | ICD-10-CM | POA: Diagnosis not present

## 2014-05-30 DIAGNOSIS — F17210 Nicotine dependence, cigarettes, uncomplicated: Secondary | ICD-10-CM | POA: Diagnosis not present

## 2014-05-30 HISTORY — PX: CHOLECYSTECTOMY: SHX55

## 2014-05-30 SURGERY — LAPAROSCOPIC CHOLECYSTECTOMY WITH INTRAOPERATIVE CHOLANGIOGRAM
Anesthesia: General

## 2014-05-30 MED ORDER — GLYCOPYRROLATE 0.2 MG/ML IJ SOLN
INTRAMUSCULAR | Status: DC | PRN
  Administered 2014-05-30: .7 mg via INTRAVENOUS

## 2014-05-30 MED ORDER — ONDANSETRON HCL 4 MG/2ML IJ SOLN
INTRAMUSCULAR | Status: DC | PRN
  Administered 2014-05-30: 4 mg via INTRAVENOUS

## 2014-05-30 MED ORDER — NEOSTIGMINE METHYLSULFATE 10 MG/10ML IV SOLN
INTRAVENOUS | Status: AC
  Filled 2014-05-30: qty 1

## 2014-05-30 MED ORDER — 0.9 % SODIUM CHLORIDE (POUR BTL) OPTIME
TOPICAL | Status: DC | PRN
  Administered 2014-05-30: 1000 mL

## 2014-05-30 MED ORDER — SCOPOLAMINE 1 MG/3DAYS TD PT72
MEDICATED_PATCH | TRANSDERMAL | Status: DC | PRN
  Administered 2014-05-30: 1 via TRANSDERMAL

## 2014-05-30 MED ORDER — BUPIVACAINE HCL (PF) 0.25 % IJ SOLN
INTRAMUSCULAR | Status: AC
  Filled 2014-05-30: qty 30

## 2014-05-30 MED ORDER — HYDROCODONE-ACETAMINOPHEN 10-325 MG PO TABS
1.0000 | ORAL_TABLET | ORAL | Status: DC | PRN
  Administered 2014-05-31 (×2): 1 via ORAL
  Filled 2014-05-30 (×2): qty 1

## 2014-05-30 MED ORDER — GLYCOPYRROLATE 0.2 MG/ML IJ SOLN
INTRAMUSCULAR | Status: AC
  Filled 2014-05-30: qty 3

## 2014-05-30 MED ORDER — MIDAZOLAM HCL 2 MG/2ML IJ SOLN
INTRAMUSCULAR | Status: AC
  Filled 2014-05-30: qty 2

## 2014-05-30 MED ORDER — HYDROCODONE-ACETAMINOPHEN 10-325 MG PO TABS: 1.0000 | ORAL_TABLET | Freq: Four times a day (QID) | ORAL | Status: DC | PRN

## 2014-05-30 MED ORDER — SUCCINYLCHOLINE CHLORIDE 20 MG/ML IJ SOLN
INTRAMUSCULAR | Status: DC | PRN
  Administered 2014-05-30: 120 mg via INTRAVENOUS

## 2014-05-30 MED ORDER — HYDROMORPHONE HCL 1 MG/ML IJ SOLN
INTRAMUSCULAR | Status: AC
  Filled 2014-05-30: qty 1

## 2014-05-30 MED ORDER — HYDROMORPHONE HCL 1 MG/ML IJ SOLN
0.2500 mg | INTRAMUSCULAR | Status: DC | PRN
  Administered 2014-05-30: 1 mg via INTRAVENOUS

## 2014-05-30 MED ORDER — LACTATED RINGERS IV SOLN
INTRAVENOUS | Status: DC | PRN
  Administered 2014-05-30: 10:00:00 via INTRAVENOUS

## 2014-05-30 MED ORDER — SUCCINYLCHOLINE CHLORIDE 20 MG/ML IJ SOLN
INTRAMUSCULAR | Status: AC
  Filled 2014-05-30: qty 1

## 2014-05-30 MED ORDER — LIDOCAINE HCL (CARDIAC) 20 MG/ML IV SOLN
INTRAVENOUS | Status: DC | PRN
  Administered 2014-05-30: 100 mg via INTRAVENOUS

## 2014-05-30 MED ORDER — SODIUM CHLORIDE 0.9 % IR SOLN
Status: DC | PRN
  Administered 2014-05-30: 1

## 2014-05-30 MED ORDER — KETOROLAC TROMETHAMINE 30 MG/ML IJ SOLN
INTRAMUSCULAR | Status: AC
  Filled 2014-05-30: qty 1

## 2014-05-30 MED ORDER — KETOROLAC TROMETHAMINE 30 MG/ML IJ SOLN
30.0000 mg | Freq: Once | INTRAMUSCULAR | Status: AC | PRN
  Administered 2014-05-30: 30 mg via INTRAVENOUS

## 2014-05-30 MED ORDER — BUPIVACAINE HCL 0.25 % IJ SOLN
INTRAMUSCULAR | Status: DC | PRN
  Administered 2014-05-30: 15 mL

## 2014-05-30 MED ORDER — PROPOFOL 10 MG/ML IV BOLUS
INTRAVENOUS | Status: DC | PRN
  Administered 2014-05-30: 50 mg via INTRAVENOUS
  Administered 2014-05-30: 150 mg via INTRAVENOUS

## 2014-05-30 MED ORDER — SODIUM CHLORIDE 0.9 % IV SOLN
INTRAVENOUS | Status: DC | PRN
  Administered 2014-05-30: 11:00:00

## 2014-05-30 MED ORDER — LIDOCAINE HCL (CARDIAC) 20 MG/ML IV SOLN
INTRAVENOUS | Status: AC
  Filled 2014-05-30: qty 5

## 2014-05-30 MED ORDER — LABETALOL HCL 5 MG/ML IV SOLN
INTRAVENOUS | Status: DC | PRN
  Administered 2014-05-30: 5 mg via INTRAVENOUS
  Administered 2014-05-30: 10 mg via INTRAVENOUS

## 2014-05-30 MED ORDER — DEXAMETHASONE SODIUM PHOSPHATE 4 MG/ML IJ SOLN
INTRAMUSCULAR | Status: AC
  Filled 2014-05-30: qty 1

## 2014-05-30 MED ORDER — FENTANYL CITRATE 0.05 MG/ML IJ SOLN
INTRAMUSCULAR | Status: AC
  Filled 2014-05-30: qty 5

## 2014-05-30 MED ORDER — MEPERIDINE HCL 25 MG/ML IJ SOLN: 6.2500 mg | INTRAMUSCULAR | Status: DC | PRN

## 2014-05-30 MED ORDER — SCOPOLAMINE 1 MG/3DAYS TD PT72
MEDICATED_PATCH | TRANSDERMAL | Status: AC
  Filled 2014-05-30: qty 1

## 2014-05-30 MED ORDER — PROMETHAZINE HCL 25 MG/ML IJ SOLN: 6.2500 mg | INTRAMUSCULAR | Status: DC | PRN

## 2014-05-30 MED ORDER — SCOPOLAMINE 1 MG/3DAYS TD PT72: 1.0000 | MEDICATED_PATCH | TRANSDERMAL | Status: DC

## 2014-05-30 MED ORDER — MIDAZOLAM HCL 5 MG/5ML IJ SOLN
INTRAMUSCULAR | Status: DC | PRN
  Administered 2014-05-30: 2 mg via INTRAVENOUS

## 2014-05-30 MED ORDER — ROCURONIUM BROMIDE 100 MG/10ML IV SOLN
INTRAVENOUS | Status: DC | PRN
  Administered 2014-05-30: 40 mg via INTRAVENOUS
  Administered 2014-05-30: 10 mg via INTRAVENOUS

## 2014-05-30 MED ORDER — NEOSTIGMINE METHYLSULFATE 10 MG/10ML IV SOLN
INTRAVENOUS | Status: DC | PRN
  Administered 2014-05-30: 4 mg via INTRAVENOUS

## 2014-05-30 MED ORDER — FENTANYL CITRATE 0.05 MG/ML IJ SOLN
INTRAMUSCULAR | Status: DC | PRN
  Administered 2014-05-30: 150 ug via INTRAVENOUS
  Administered 2014-05-30: 100 ug via INTRAVENOUS

## 2014-05-30 MED ORDER — PROPOFOL 10 MG/ML IV BOLUS
INTRAVENOUS | Status: AC
  Filled 2014-05-30: qty 20

## 2014-05-30 SURGICAL SUPPLY — 45 items
APPLIER CLIP 5 13 M/L LIGAMAX5 (MISCELLANEOUS)
APPLIER CLIP ROT 10 11.4 M/L (STAPLE)
APR CLP MED LRG 11.4X10 (STAPLE)
APR CLP MED LRG 5 ANG JAW (MISCELLANEOUS)
BAG SPEC RTRVL LRG 6X4 10 (ENDOMECHANICALS) ×1
BLADE SURG ROTATE 9660 (MISCELLANEOUS) IMPLANT
CANISTER SUCTION 2500CC (MISCELLANEOUS) ×3 IMPLANT
CHLORAPREP W/TINT 26ML (MISCELLANEOUS) ×3 IMPLANT
CHOLANGIOGRAM CATH TAUT (CATHETERS) ×3 IMPLANT
CLIP APPLIE 5 13 M/L LIGAMAX5 (MISCELLANEOUS) IMPLANT
CLIP APPLIE ROT 10 11.4 M/L (STAPLE) IMPLANT
COVER MAYO STAND STRL (DRAPES) ×3 IMPLANT
COVER SURGICAL LIGHT HANDLE (MISCELLANEOUS) ×3 IMPLANT
DRAPE C-ARM 42X72 X-RAY (DRAPES) ×3 IMPLANT
DRAPE LAPAROSCOPIC ABDOMINAL (DRAPES) ×3 IMPLANT
ELECT REM PT RETURN 9FT ADLT (ELECTROSURGICAL) ×3
ELECTRODE REM PT RTRN 9FT ADLT (ELECTROSURGICAL) ×1 IMPLANT
FILTER SMOKE EVAC LAPAROSHD (FILTER) ×3 IMPLANT
GLOVE SURG SIGNA 7.5 PF LTX (GLOVE) ×3 IMPLANT
GOWN STRL REUS W/ TWL LRG LVL3 (GOWN DISPOSABLE) ×2 IMPLANT
GOWN STRL REUS W/ TWL XL LVL3 (GOWN DISPOSABLE) ×1 IMPLANT
GOWN STRL REUS W/TWL LRG LVL3 (GOWN DISPOSABLE) ×6
GOWN STRL REUS W/TWL XL LVL3 (GOWN DISPOSABLE) ×3
IV CATH 14GX2 1/4 (CATHETERS) ×3 IMPLANT
KIT BASIN OR (CUSTOM PROCEDURE TRAY) ×3 IMPLANT
KIT ROOM TURNOVER OR (KITS) ×3 IMPLANT
LIQUID BAND (GAUZE/BANDAGES/DRESSINGS) ×3 IMPLANT
NS IRRIG 1000ML POUR BTL (IV SOLUTION) ×3 IMPLANT
PAD ARMBOARD 7.5X6 YLW CONV (MISCELLANEOUS) ×3 IMPLANT
POUCH SPECIMEN RETRIEVAL 10MM (ENDOMECHANICALS) ×3 IMPLANT
SCISSORS LAP 5X35 DISP (ENDOMECHANICALS) ×3 IMPLANT
SET IRRIG TUBING LAPAROSCOPIC (IRRIGATION / IRRIGATOR) ×3 IMPLANT
SLEEVE ENDOPATH XCEL 5M (ENDOMECHANICALS) ×3 IMPLANT
SPECIMEN JAR SMALL (MISCELLANEOUS) ×3 IMPLANT
SPONGE GAUZE 4X4 12PLY STER LF (GAUZE/BANDAGES/DRESSINGS) ×2 IMPLANT
STOPCOCK 4 WAY LG BORE MALE ST (IV SETS) ×3 IMPLANT
SUT MON AB 5-0 PS2 18 (SUTURE) ×3 IMPLANT
TOWEL OR 17X24 6PK STRL BLUE (TOWEL DISPOSABLE) ×3 IMPLANT
TOWEL OR 17X26 10 PK STRL BLUE (TOWEL DISPOSABLE) ×3 IMPLANT
TRAY LAPAROSCOPIC (CUSTOM PROCEDURE TRAY) ×3 IMPLANT
TROCAR XCEL BLUNT TIP 100MML (ENDOMECHANICALS) ×3 IMPLANT
TROCAR XCEL NON-BLD 11X100MML (ENDOMECHANICALS) IMPLANT
TROCAR XCEL NON-BLD 5MMX100MML (ENDOMECHANICALS) ×5 IMPLANT
TUBING EXTENTION W/L.L. (IV SETS) ×3 IMPLANT
TUBING INSUFFLATION (TUBING) ×3 IMPLANT

## 2014-05-30 NOTE — Op Note (Signed)
05/29/2014 - 05/30/2014  12:01 PM  PATIENT:  Madeline Silva, 44 y.o., female, MRN: [id number]  PREOP DIAGNOSIS:  Cholelithiasis, chronic cholecystitis  POSTOP DIAGNOSIS:   Cholelithiasis, chronic cholecystitis  PROCEDURE:   Procedure(s): LAPAROSCOPIC CHOLECYSTECTOMY WITH INTRAOPERATIVE CHOLANGIOGRAM  SURGEON:   Ovidio Kin, M.D.  ASSISTANT:   Barnetta Chapel, PA  ANESTHESIA:   general  Anesthesiologist: Lewie Loron, MD; Gaynelle Adu, MD CRNA: Margaree Mackintosh, CRNA; Marena Chancy, CRNA  General  ASA: 2  EBL:  minimal  ml  BLOOD ADMINISTERED: none  DRAINS: none   LOCAL MEDICATIONS USED:   20 cc 1/4 % marcaine  SPECIMEN:   Gall bladder  COUNTS CORRECT:  YES  INDICATIONS FOR PROCEDURE:  [name] is a 44 y.o. (DOB: Aug 26, 1970) AA  female whose primary care physician is No primary care provider on file. and comes for cholecystectomy.   The indications and risks of the gall bladder surgery were explained to the patient.  The risks include, but are not limited to, infection, bleeding, common bile duct injury and open surgery.  SURGERY:  The patient was taken to room #1 at [facility] OR.  The abdomen was prepped with chloroprep.  The patient was already on Rocephin   A time out was held and the surgical checklist run.   An infraumbilical incision was made into the abdominal cavity.  A 12 mm Hasson trocar was inserted into the abdominal cavity through the infraumbilical incision and secured with a 0 Vicryl suture.  Three additional trocars were inserted: a 10 mm trocar in the sub-xiphoid location, a 5 mm trocar in the right mid subcostal area, and a 5 mm trocar in the right lateral subcostal area.   The abdomen was thickened, consistent with chronic cholecystitis.  The liver, stomach, and bowel that could be seen were unremarkable.   The gall bladder was identified, grasped, and rotated cephalad.  Disssection was carried down to the gall bladder/cystic duct  junction and the cystic duct isolated.  A clip was placed on the gall bladder side of the cystic duct.   An intra-operative cholangiogram was shot.   The intra-operative cholangiogram was shot using a cut off Taut catheter placed through a 14 gauge angiocath in the RUQ.  The Taut catheter was inserted in the cut cystic duct and secured with an endoclip.  A cholangiogram was shot with 8 cc of 1/2 strength Omnipaque.  Using fluoroscopy, the cholangiogram showed the flow of contrast into the common bile duct, up the hepatic radicals, and into the duodenum.  There was no mass or obstruction.  This was a normal intra-operative cholangiogram.   The Taut catheter was removed.  The cystic duct was tripley endoclipped and the cystic artery was identified and clipped.  The gall bladder was bluntly and sharpley dissected from the gall bladder bed.   After the gall bladder was removed from the liver, the gall bladder bed and Triangle of [place] were inspected.  There was no bleeding or bile leak.  The gall bladder was placed in a endocatch bag and delivered through the umbilicus.  The abdomen was irrigated with 500 cc saline.   The trocars were then removed.  I infiltrated 20cc of 1/4% Marcaine into the incisions.  The umbilical port closed with a 0 Vicryl suture and the skin closed with 5-0 Monocryl.  The skin was painted with LiquiBand.  The patient's sponge and needle count were correct.  The patient was transported to the RR in good  condition.  Ovidio Kinavid Lysander Calixte, MD, [facility] Pager: [phone] Office phone:  [phone]

## 2014-05-30 NOTE — Anesthesia Preprocedure Evaluation (Signed)
Anesthesia Evaluation  Patient identified by MRN, date of birth, ID band Patient awake    Reviewed: Allergy & Precautions, H&P , NPO status , Patient's Chart, lab work & pertinent test results  Airway Mallampati: III       Dental no notable dental hx.    Pulmonary neg pulmonary ROS, Current Smoker,  breath sounds clear to auscultation  Pulmonary exam normal       Cardiovascular Exercise Tolerance: Good negative cardio ROS  Rhythm:regular Rate:Normal     Neuro/Psych  Headaches, negative neurological ROS  negative psych ROS   GI/Hepatic negative GI ROS, Neg liver ROS,   Endo/Other  Morbid obesity  Renal/GU negative Renal ROS     Musculoskeletal   Abdominal Normal abdominal exam  (+)   Peds  Hematology negative hematology ROS (+)   Anesthesia Other Findings    migraines  Notes state patient has had seizures... Upon direct questioning, patient denies any seizure history  Reproductive/Obstetrics (+) Pregnancy                             Anesthesia Physical  Anesthesia Plan  ASA: III  Anesthesia Plan: General   Post-op Pain Management:    Induction: Intravenous  Airway Management Planned: Oral ETT  Additional Equipment:   Intra-op Plan:   Post-operative Plan: Extubation in OR  Informed Consent: I have reviewed the patients History and Physical, chart, labs and discussed the procedure including the risks, benefits and alternatives for the proposed anesthesia with the patient or authorized representative who has indicated his/her understanding and acceptance.   Dental advisory given  Plan Discussed with: CRNA  Anesthesia Plan Comments:         Anesthesia Quick Evaluation

## 2014-05-30 NOTE — Anesthesia Postprocedure Evaluation (Signed)
  Anesthesia Post-op Note  Patient: Maida SaleKim Hoskie  Procedure(s) Performed: Procedure(s): LAPAROSCOPIC CHOLECYSTECTOMY WITH INTRAOPERATIVE CHOLANGIOGRAM (N/A)  Patient Location: PACU  Anesthesia Type:General  Level of Consciousness: awake and alert   Airway and Oxygen Therapy: Patient Spontanous Breathing  Post-op Pain: mild  Post-op Assessment: Post-op Vital signs reviewed, Patient's Cardiovascular Status Stable and Respiratory Function Stable  Post-op Vital Signs: Reviewed  Filed Vitals:   05/30/14 1319  BP: 154/79  Pulse: 63  Temp: 36.7 C  Resp: 16    Complications: No apparent anesthesia complications

## 2014-05-30 NOTE — Progress Notes (Signed)
UR completed 

## 2014-05-30 NOTE — Transfer of Care (Signed)
Immediate Anesthesia Transfer of Care Note  Patient: Madeline Silva  Procedure(s) Performed: Procedure(s): LAPAROSCOPIC CHOLECYSTECTOMY WITH INTRAOPERATIVE CHOLANGIOGRAM (N/A)  Patient Location: PACU  Anesthesia Type:General  Level of Consciousness: awake, alert  and oriented  Airway & Oxygen Therapy: Patient Spontanous Breathing and Patient connected to nasal cannula oxygen  Post-op Assessment: Report given to RN and Post -op Vital signs reviewed and stable  Post vital signs: Reviewed and stable  Last Vitals:  Filed Vitals:   05/30/14 0902  BP: 148/73  Pulse: 76  Temp: 36.9 C  Resp: 18    Complications: No apparent anesthesia complications

## 2014-05-31 ENCOUNTER — Encounter (HOSPITAL_COMMUNITY): Payer: Self-pay | Admitting: Surgery

## 2014-05-31 MED ORDER — HYDROCODONE-ACETAMINOPHEN 10-325 MG PO TABS: 1.0000 | ORAL_TABLET | ORAL | Status: DC | PRN

## 2014-05-31 NOTE — Progress Notes (Signed)
Discharge paperwork given to patient. Prescriptions given to patient. No questions verbalized by patient. Patient ready to walk out

## 2014-05-31 NOTE — Discharge Summary (Signed)
Patient ID: Madeline SaleKim Cail MRN: [id number] DOB/AGE: Jun 19, 1970 44 y.o.  Admit date: 05/29/2014 Discharge date: 05/31/2014  Procedures: lap chole with IOC  Consults: None  Reason for Admission: This is a 44 yo morbidly obese black female who ate steak for supper and then ice cream around 10pm. She awoke at 0100am with acute severe epigastric and RUQ abdominal pain. She has never had pain like this before. She has a lot of nausea and vomiting, but no diarrhea. She denies fevers. Her [facility] persisted [facility] She admits to some reflux, but denies any significant use of NSAIDs. She had an US here that revealed wall thickening of her gallbladder, but no gallstones. She did have a + Murphy's sign. Her labs were all normal. We have been asked to see her for admission.  Admission Diagnoses:  1. Acalculous cholecystitis  Hospital Course: The patient was admitted and taken to the OR the following day where she underwent a lap chole with IOC.  The patient tolerated this well.    On POD 1, the patient's pain was well controlled and she was tolerating a solid diet.  She was felt stable for dc home.  PE: Abd: soft, appropriately tender, +BS, Nd, obese, incisions c/d/i  Discharge Diagnoses:  Principal Problem:   Acalculous cholecystitis s/p lap chole  Discharge Medications:   Medication List    STOP taking these medications        dextromethorphan-guaiFENesin 30-600 MG per 12 hr tablet  Commonly known as:  MUCINEX DM     naproxen 500 MG tablet  Commonly known as:  NAPROSYN     oxyCODONE-acetaminophen 5-325 MG per tablet  Commonly known as:  PERCOCET/ROXICET      TAKE these medications        HYDROcodone-acetaminophen 10-325 MG per tablet  Commonly known as:  NORCO  Take 1 tablet by mouth every 4 (four) hours as needed for moderate pain.        Discharge Instructions:     Follow-up Information    Follow up with [facility] On 06/19/2014.   Specialty:   General Surgery   Why:  [facility] the Week [facility], 2:45pm, arrive no later than 2:15pm for paperwork   Contact information:   [address] [place] [phone]       Signed: Letha CapeOSBORNE,KELLY E 05/31/2014, 12:25 PM  Agree with above.  Ovidio Kinavid Alarik Radu, MD, [facility] Pager: [phone] Office phone:  [phone]

## 2014-05-31 NOTE — Discharge Instructions (Signed)
[facility] ______[facility] °LAPAROSCOPIC SURGERY: POST OP INSTRUCTIONS °Always review your discharge instruction sheet given to you by the facility where your surgery was performed. °IF YOU HAVE DISABILITY OR FAMILY LEAVE FORMS, YOU MUST BRING THEM TO THE OFFICE FOR PROCESSING.   °DO NOT GIVE THEM TO YOUR DOCTOR. ° °1. A prescription for pain medication may be given to you upon discharge.  Take your pain medication as prescribed, if needed.  If narcotic pain medicine is not needed, then you may take acetaminophen (Tylenol) or ibuprofen (Advil) as needed. °2. Take your usually prescribed medications unless otherwise directed. °3. If you need a refill on your pain medication, please contact your pharmacy.  They will contact our office to request authorization. Prescriptions will not be filled after 5pm or on week-ends. °4. You should follow a light diet the first few days after arrival home, such as soup and crackers, etc.  Be sure to include lots of fluids daily. °5. Most patients will experience some swelling and bruising in the area of the incisions.  Ice packs will help.  Swelling and bruising can take several days to resolve.  °6. It is common to experience some constipation if taking pain medication after surgery.  Increasing fluid intake and taking a stool softener (such as Colace) will usually help or prevent this problem from occurring.  A mild laxative (Milk of Magnesia or Miralax) should be taken according to package instructions if there are no bowel movements after 48 hours. °7. Unless discharge instructions indicate otherwise, you may remove your bandages 24-48 hours after surgery, and you may shower at that time.  You may have steri-strips (small skin tapes) in place directly over the incision.  These strips should be left on the skin for 7-10 days.  If your surgeon used skin glue on the incision, you may shower in 24 hours.  The glue will flake off over the next 2-3 weeks.  Any sutures or  staples will be removed at the office during your follow-up visit. °8. ACTIVITIES:  You may resume regular (light) daily activities beginning the next day--such as daily self-care, walking, climbing stairs--gradually increasing activities as tolerated.  You may have sexual intercourse when it is comfortable.  Refrain from any heavy lifting or straining until approved by your doctor. °a. You may drive when you are no longer taking prescription pain medication, you can comfortably wear a seatbelt, and you can safely maneuver your car and apply brakes. °b. RETURN TO WORK:  __________________________________________________________ °9. You should see your doctor in the office for a follow-up appointment approximately 2-3 weeks after your surgery.  Make sure that you call for this appointment within a day or two after you arrive home to insure a convenient appointment time. °10. OTHER INSTRUCTIONS: __________________________________________________________________________________________________________________________ __________________________________________________________________________________________________________________________ °WHEN TO CALL YOUR DOCTOR: °1. Fever over 101.0 °2. Inability to urinate °3. Continued bleeding from incision. °4. Increased pain, redness, or drainage from the incision. °5. Increasing abdominal pain ° °The clinic staff is available to answer your questions during regular business hours.  Please don’t hesitate to call and ask to speak to one of the nurses for clinical concerns.  If you have a medical emergency, go to the nearest emergency room or call 911.  A surgeon from [facility] is always on call at the hospital. °[address], [address], [place], [place]  [address] ? [address] [address], [place], [place]   [address] °[phone] ? [phone] ? FAX [phone] °Web site:   [email or web address] °

## 2014-06-11 ENCOUNTER — Encounter (HOSPITAL_COMMUNITY): Payer: Self-pay | Admitting: Emergency Medicine

## 2014-06-11 ENCOUNTER — Emergency Department (HOSPITAL_COMMUNITY)
Admission: EM | Admit: 2014-06-11 | Discharge: 2014-06-11 | Disposition: A | Discharge status: Home / Self Care | Payer: Medicaid Other | Attending: Emergency Medicine | Admitting: Emergency Medicine

## 2014-06-11 ENCOUNTER — Emergency Department (HOSPITAL_COMMUNITY): Payer: Medicaid Other

## 2014-06-11 DIAGNOSIS — M79605 Pain in left leg: Secondary | ICD-10-CM | POA: Insufficient documentation

## 2014-06-11 DIAGNOSIS — E669 Obesity, unspecified: Secondary | ICD-10-CM | POA: Insufficient documentation

## 2014-06-11 DIAGNOSIS — R200 Anesthesia of skin: Secondary | ICD-10-CM | POA: Insufficient documentation

## 2014-06-11 DIAGNOSIS — R1032 Left lower quadrant pain: Secondary | ICD-10-CM | POA: Diagnosis present

## 2014-06-11 DIAGNOSIS — R202 Paresthesia of skin: Secondary | ICD-10-CM | POA: Insufficient documentation

## 2014-06-11 DIAGNOSIS — Z3202 Encounter for pregnancy test, result negative: Secondary | ICD-10-CM | POA: Diagnosis not present

## 2014-06-11 DIAGNOSIS — L03315 Cellulitis of perineum: Secondary | ICD-10-CM | POA: Diagnosis not present

## 2014-06-11 DIAGNOSIS — L0291 Cutaneous abscess, unspecified: Secondary | ICD-10-CM

## 2014-06-11 DIAGNOSIS — M79609 Pain in unspecified limb: Secondary | ICD-10-CM | POA: Diagnosis not present

## 2014-06-11 DIAGNOSIS — Z720 Tobacco use: Secondary | ICD-10-CM | POA: Diagnosis not present

## 2014-06-11 LAB — COMPREHENSIVE METABOLIC PANEL
ALT: 14 U/L (ref 0–35)
ANION GAP: 7 (ref 5–15)
AST: 20 U/L (ref 0–37)
Albumin: 3.2 g/dL — ABNORMAL LOW (ref 3.5–5.2)
Alkaline Phosphatase: 101 U/L (ref 39–117)
BUN: 10 mg/dL (ref 6–23)
CALCIUM: 8.8 mg/dL (ref 8.4–10.5)
CO2: 25 mmol/L (ref 19–32)
CREATININE: 0.93 mg/dL (ref 0.50–1.10)
Chloride: 106 mmol/L (ref 96–112)
GFR, EST AFRICAN AMERICAN: 87 mL/min — AB (ref >90)
GFR, EST NON AFRICAN AMERICAN: 75 mL/min — AB (ref >90)
Glucose, Bld: 85 mg/dL (ref 70–99)
Potassium: 3.8 mmol/L (ref 3.5–5.1)
Sodium: 138 mmol/L (ref 135–145)
Total Bilirubin: 1 mg/dL (ref 0.3–1.2)
Total Protein: 6.6 g/dL (ref 6.0–8.3)

## 2014-06-11 LAB — CBC WITH DIFFERENTIAL/PLATELET
BASOS ABS: 0.1 10*3/uL (ref 0.0–0.1)
Basophils Relative: 1 % (ref 0–1)
Eosinophils Absolute: 0.5 10*3/uL (ref 0.0–0.7)
Eosinophils Relative: 6 % — ABNORMAL HIGH (ref 0–5)
HCT: 38.4 % (ref 36.0–46.0)
Hemoglobin: 12.3 g/dL (ref 12.0–15.0)
Lymphocytes Relative: 22 % (ref 12–46)
Lymphs Abs: 1.9 10*3/uL (ref 0.7–4.0)
MCH: 29.1 pg (ref 26.0–34.0)
MCHC: 32 g/dL (ref 30.0–36.0)
MCV: 91 fL (ref 78.0–100.0)
MONO ABS: 0.9 10*3/uL (ref 0.1–1.0)
Monocytes Relative: 10 % (ref 3–12)
NEUTROS ABS: 5.5 10*3/uL (ref 1.7–7.7)
NEUTROS PCT: 61 % (ref 43–77)
PLATELETS: 232 10*3/uL (ref 150–400)
RBC: 4.22 MIL/uL (ref 3.87–5.11)
RDW: 14.3 % (ref 11.5–15.5)
WBC: 8.9 10*3/uL (ref 4.0–10.5)

## 2014-06-11 LAB — POC URINE PREG, ED: Preg Test, Ur: NEGATIVE

## 2014-06-11 LAB — WET PREP, GENITAL
Trich, Wet Prep: NONE SEEN
WBC, Wet Prep HPF POC: NONE SEEN
Yeast Wet Prep HPF POC: NONE SEEN

## 2014-06-11 LAB — URINALYSIS, ROUTINE W REFLEX MICROSCOPIC
Bilirubin Urine: NEGATIVE
GLUCOSE, UA: NEGATIVE mg/dL
Hgb urine dipstick: NEGATIVE
KETONES UR: NEGATIVE mg/dL
Leukocytes, UA: NEGATIVE
Nitrite: NEGATIVE
PH: 6 (ref 5.0–8.0)
PROTEIN: NEGATIVE mg/dL
Specific Gravity, Urine: 1.026 (ref 1.005–1.030)
Urobilinogen, UA: 1 mg/dL (ref 0.0–1.0)

## 2014-06-11 MED ORDER — TRAMADOL HCL 50 MG PO TABS: 50.0000 mg | ORAL_TABLET | Freq: Four times a day (QID) | ORAL | Status: DC | PRN

## 2014-06-11 MED ORDER — IOHEXOL 300 MG/ML  SOLN
25.0000 mL | Freq: Once | INTRAMUSCULAR | Status: AC | PRN
  Administered 2014-06-11: 25 mL via ORAL

## 2014-06-11 MED ORDER — SULFAMETHOXAZOLE-TRIMETHOPRIM 800-160 MG PO TABS: 1.0000 | ORAL_TABLET | Freq: Two times a day (BID) | ORAL | Status: AC

## 2014-06-11 MED ORDER — CEPHALEXIN 500 MG PO CAPS: 500.0000 mg | ORAL_CAPSULE | Freq: Four times a day (QID) | ORAL | Status: DC

## 2014-06-11 MED ORDER — FLUCONAZOLE 200 MG PO TABS: 200.0000 mg | ORAL_TABLET | Freq: Every day | ORAL | Status: AC

## 2014-06-11 MED ORDER — IOHEXOL 300 MG/ML  SOLN
100.0000 mL | Freq: Once | INTRAMUSCULAR | Status: AC | PRN
  Administered 2014-06-11: 100 mL via INTRAVENOUS

## 2014-06-11 MED ORDER — ONDANSETRON HCL 4 MG/2ML IJ SOLN
4.0000 mg | INTRAMUSCULAR | Status: AC
  Administered 2014-06-11: 4 mg via INTRAVENOUS
  Filled 2014-06-11: qty 2

## 2014-06-11 MED ORDER — SODIUM CHLORIDE 0.9 % IV BOLUS (SEPSIS)
1000.0000 mL | INTRAVENOUS | Status: AC
  Administered 2014-06-11: 1000 mL via INTRAVENOUS

## 2014-06-11 MED ORDER — LIDOCAINE HCL (PF) 1 % IJ SOLN
INTRAMUSCULAR | Status: AC
Start: 2014-06-11 — End: 2014-06-11
  Administered 2014-06-11: 12:00:00
  Filled 2014-06-11: qty 5

## 2014-06-11 MED ORDER — HYDROMORPHONE HCL 1 MG/ML IJ SOLN
1.0000 mg | Freq: Once | INTRAMUSCULAR | Status: AC
  Administered 2014-06-11: 1 mg via INTRAVENOUS
  Filled 2014-06-11: qty 1

## 2014-06-11 NOTE — ED Notes (Signed)
Pt c/o left leg pain that radiates from foot to low back.  Pt also c/o boil to vaginal area.  Pt had her gallbladder removed the beginning of this month.

## 2014-06-11 NOTE — Discharge Instructions (Signed)
Please follow the directions provided.  Be sure to return in 2 days for wound re-check. Use the resource guide or the referral given to establish care with a primary care doctor. You may take Tylenol or ibuprofen to help with pain.Marland Kitchen. Please take antibiotics as directed until they're all gone. Be sure to use warm soapy water soaks twice a day to taking 2 new drainage from your abscess. Don't hesitate to return for any new, worsening, or concerning symptoms.   SEEK IMMEDIATE MEDICAL CARE IF:  You develop increased pain, swelling, redness, drainage, or bleeding in the wound site.  You develop signs of generalized infection including muscle aches, chills, fever, or a general ill feeling.  An oral temperature above 102 F (38.9 C) develops, not controlled by medication.

## 2014-06-11 NOTE — ED Notes (Signed)
Patient transported to CT 

## 2014-06-11 NOTE — ED Notes (Signed)
Transported to Vascular lab 

## 2014-06-11 NOTE — Progress Notes (Signed)
VASCULAR LAB PRELIMINARY  PRELIMINARY  PRELIMINARY  PRELIMINARY  LLEV completed.    Preliminary report: Negative DVT LLEV.  Loralie ChampagneBishop, Sundeep Cary F, RVT 06/11/2014, 10:58 AM

## 2014-06-11 NOTE — ED Provider Notes (Signed)
CSN: [id number]     Arrival date & time 06/11/14  [id number] History   First MD Initiated Contact with Patient 06/11/14 [id number]     Chief Complaint  Patient presents with  . Abscess  . Leg Pain   (Consider location/radiation/quality/duration/timing/severity/associated sxs/prior Treatment) HPI Madeline Silva is a 44 year old female presenting with leg discomfort, abdominal pain and vaginal complaints. She reports she has had paresthesia in her left leg radiating from her left ankle up to her left thigh. She states it only occurs intermittently when she steps onto her left foot. She did have surgery 3 weeks ago to have her gallbladder removed. She states she's also had worsening left lower quadrant pain 3 days. She currently rates it as an 8 out of 10 and describes it as aching. She states it radiates to her left lower back. She also reports she has noticed a painful swollen area on her right labia. She endorses chills but has not recorded a fever. She denies any nausea, vomiting, vaginal discharge or urinary symptoms. She denies any chest pain or shortness of breath also.  Past Medical History  Diagnosis Date  . Seizures     migraines  . Obesity    Past Surgical History  Procedure Laterality Date  . Cesarean section    . Cesarean section with bilateral tubal ligation Bilateral 05/17/2012    Procedure: CESAREAN SECTION WITH BILATERAL TUBAL LIGATION;  Surgeon: Mickel Baasichard D Kaplan, MD;  Location: [facility];  Service: Obstetrics;  Laterality: Bilateral;  . Cholecystectomy N/A 05/30/2014    Procedure: LAPAROSCOPIC CHOLECYSTECTOMY WITH INTRAOPERATIVE CHOLANGIOGRAM;  Surgeon: Ovidio Kinavid Newman, MD;  [facility];  Service: General;  Laterality: N/A;   Family History  Problem Relation Age of Onset  . Alcohol abuse Neg Hx   . Arthritis Neg Hx   . Asthma Neg Hx   . Birth defects Neg Hx   . Cancer Neg Hx   . COPD Neg Hx   . Depression Neg Hx   . Diabetes Neg Hx   . Drug abuse Neg Hx   . Early death Neg Hx   .  Hearing loss Neg Hx   . Heart disease Neg Hx   . Hyperlipidemia Neg Hx   . Hypertension Neg Hx   . Kidney disease Neg Hx   . Learning disabilities Neg Hx   . Mental illness Neg Hx   . Mental retardation Neg Hx   . Miscarriages / Stillbirths Neg Hx   . Stroke Neg Hx   . Vision loss Neg Hx    History  Substance Use Topics  . Smoking status: Current Every Day Smoker -- 0.25 packs/day    Types: Cigarettes  . Smokeless tobacco: Never Used  . Alcohol Use: No   OB History    Gravida Para Term Preterm AB TAB SAB Ectopic Multiple Living   4 2 1 1 2 1 1   2      Review of Systems  Constitutional: Positive for chills. Negative for fever.  HENT: Negative for sore throat.   Eyes: Negative for visual disturbance.  Respiratory: Negative for cough and shortness of breath.   Cardiovascular: Negative for chest pain and leg swelling.  Gastrointestinal: Positive for abdominal pain. Negative for nausea, vomiting and diarrhea.  Genitourinary: Positive for vaginal pain. Negative for dysuria and vaginal discharge.  Musculoskeletal: Negative for myalgias.  Skin: Positive for rash.  Neurological: Positive for numbness. Negative for weakness and headaches.      Allergies  Review of patient's  allergies indicates no known allergies.  Home Medications   Prior to Admission medications   Medication Sig Start Date End Date Taking? Authorizing Provider  HYDROcodone-acetaminophen (NORCO) 10-325 MG per tablet Take 1 tablet by mouth every 4 (four) hours as needed for moderate pain. Patient not taking: Reported on 06/11/2014 05/31/14   Barnetta Chapel, PA-C   BP 140/88 mmHg  Pulse 88  Temp(Src) 98.4 F (36.9 C) (Oral)  Resp 14  Ht  (1.651 m)  Wt 280 lb (127.007 kg)  BMI 46.59 kg/m2  SpO2 99%  LMP 06/06/2014 Physical Exam  Constitutional: She appears well-developed and well-nourished. No distress.  HENT:  Head: Normocephalic and atraumatic.  Mouth/Throat: Oropharynx is clear and moist. No  oropharyngeal exudate.  Eyes: Conjunctivae are normal.  Neck: Neck supple. No thyromegaly present.  Cardiovascular: Normal rate, regular rhythm and intact distal pulses.   Pulmonary/Chest: Effort normal and breath sounds normal. No respiratory distress. She has no wheezes. She has no rales. She exhibits no tenderness.  Abdominal: Soft. She exhibits no distension and no mass. There is tenderness in the left lower quadrant. There is CVA tenderness. There is no rigidity, no rebound, no guarding, no tenderness at McBurney's point and negative Murphy's sign.    LLQ radiating to left flank  Genitourinary:    There is tenderness on the right labia.  1 cm diameter area of fluctuance and redness with surround induration  Musculoskeletal: She exhibits no tenderness.  Lymphadenopathy:    She has no cervical adenopathy.  Neurological: She is alert.  Skin: Skin is warm and dry. No rash noted. She is not diaphoretic.  Psychiatric: She has a normal mood and affect.  Nursing note and vitals reviewed.   ED Course  Procedures (including critical care time) INCISION AND DRAINAGE Performed by: Harle Battiest Consent: Verbal consent obtained. Risks and benefits: risks, benefits and alternatives were discussed Type: abscess  Body area: right labia  Anesthesia: local infiltration  Incision was made with a scalpel.  Local anesthetic: lidocaine 1% without epinephrine  Anesthetic total: 4ml  Complexity: complex Blunt dissection to break up loculations  Drainage: purulent  Drainage amount: moderate  Packing material: N/A  Patient tolerance: Patient tolerated the procedure well with no immediate complications.     Labs Review Labs Reviewed  WET PREP, GENITAL - Abnormal; Notable for the following:    Clue Cells Wet Prep HPF POC FEW (*)    All other components within normal limits  CBC WITH DIFFERENTIAL/PLATELET - Abnormal; Notable for the following:    Eosinophils Relative 6 (*)     All other components within normal limits  COMPREHENSIVE METABOLIC PANEL - Abnormal; Notable for the following:    Albumin 3.2 (*)    GFR calc non Af Amer 75 (*)    GFR calc Af Amer 87 (*)    All other components within normal limits  URINALYSIS, ROUTINE W REFLEX MICROSCOPIC - Abnormal; Notable for the following:    APPearance CLOUDY (*)    All other components within normal limits  POC URINE PREG, ED  GC/CHLAMYDIA PROBE AMP ([place])    Imaging Review Ct Abdomen Pelvis W Contrast  06/11/2014   CLINICAL DATA:  Left lower quadrant pain.  EXAM: CT ABDOMEN AND PELVIS WITH CONTRAST  TECHNIQUE: Multidetector CT imaging of the abdomen and pelvis was performed using the standard protocol following bolus administration of intravenous contrast.  CONTRAST:  OMNIPAQUE IOHEXOL 300 MG/ML  SOLN  COMPARISON:  None.  FINDINGS: The  gallbladder has been removed. There are 3 low-density lesions in the liver, 18 and 8 mm in the left lobe and 11 mm in the right lobe. These most likely represent cysts. Liver parenchyma is otherwise normal. No dilated bile ducts. Spleen, pancreas, adrenal glands, and kidneys are normal. The bowel appears normal including the terminal ileum and appendix. Uterus and ovaries and bladder appear normal. Tiny amount of free fluid in the pelvic cul-de-sac. No osseous abnormality.  IMPRESSION: Benign appearing abdomen and pelvis.  Probable cysts in the liver.   Electronically Signed   By: Francene Boyers M.D.   On: 06/11/2014 13:08     EKG Interpretation None      MDM   Final diagnoses:  Abscess  Cellulitis of perineum   44 yo female with multiple complaints including left leg paresthesia, LLQ and flank pain and labial abscess. Venous duplex done and negative for DVT, Pelvic exam shows abscess to right lateral external labia but otherwise benign internally.  CT abd/pelvis was negative for significant abnormality and did not show any pathology to explain her LLQ pain. Her  pain was managed in the ED. Her labial abscess is amenable to incision and drainage.  Abscess was not large enough to warrant packing but moderate drainage released. Discussed with pt using warm soaks,  An wound recheck in 2 days. ng.  Mild signs of cellulitis is surrounding skin.  Prescription for antibiotic therapy is indicated. Pt is well-appearing, in no acute distress and vital signs reviewed and not concerning. She appears safe to be discharged.  Discharge include resources to establish care with their PCP.  Return precautions provided. Pt aware of plan and in agreement.    Filed Vitals:   06/11/14 1145 06/11/14 1255 06/11/14 1300 06/11/14 1345  BP: 107/63 124/66 111/67 118/73  Pulse: 69 75 63 74  Temp:    98 F (36.7 C)  TempSrc:      Resp:      Height:      Weight:      SpO2: 99% 100% 99% 100%   Meds given in ED:  Medications  sodium chloride 0.9 % bolus 1,000 mL (0 mLs Intravenous Stopped 06/11/14 1216)  iohexol (OMNIPAQUE) 300 MG/ML solution 25 mL (25 mLs Oral Contrast Given 06/11/14 1115)  HYDROmorphone (DILAUDID) injection 1 mg (1 mg Intravenous Given 06/11/14 1119)  ondansetron (ZOFRAN) injection 4 mg (4 mg Intravenous Given 06/11/14 1120)  lidocaine (PF) (XYLOCAINE) 1 % injection (  Given 06/11/14 1219)  iohexol (OMNIPAQUE) 300 MG/ML solution 100 mL (100 mLs Intravenous Contrast Given 06/11/14 1227)    Discharge Medication List as of 06/11/2014  1:58 PM    START taking these medications   Details  cephALEXin (KEFLEX) 500 MG capsule Take 1 capsule (500 mg total) by mouth 4 (four) times daily., Starting 06/11/2014, Until Discontinued, Print    sulfamethoxazole-trimethoprim (BACTRIM DS,SEPTRA DS) 800-160 MG per tablet Take 1 tablet by mouth 2 (two) times daily., Starting 06/11/2014, Until Mon 06/18/14, Print          Harle Battiest, NP 06/12/14 1610  Lorre Nick, MD 06/13/14 1003

## 2014-06-12 LAB — GC/CHLAMYDIA PROBE AMP (~~LOC~~) NOT AT ARMC
Chlamydia: NEGATIVE
NEISSERIA GONORRHEA: NEGATIVE

## 2014-06-14 ENCOUNTER — Ambulatory Visit: Payer: Medicaid Other | Admitting: Family Medicine

## 2015-01-01 ENCOUNTER — Encounter (HOSPITAL_COMMUNITY): Payer: Self-pay | Admitting: Emergency Medicine

## 2015-01-01 ENCOUNTER — Emergency Department (HOSPITAL_COMMUNITY)
Admission: EM | Admit: 2015-01-01 | Discharge: 2015-01-01 | Discharge status: Against Medical Advice | Payer: Medicaid Other | Attending: Emergency Medicine | Admitting: Emergency Medicine

## 2015-01-01 DIAGNOSIS — F329 Major depressive disorder, single episode, unspecified: Secondary | ICD-10-CM | POA: Insufficient documentation

## 2015-01-01 DIAGNOSIS — F419 Anxiety disorder, unspecified: Secondary | ICD-10-CM | POA: Diagnosis not present

## 2015-01-01 DIAGNOSIS — E669 Obesity, unspecified: Secondary | ICD-10-CM | POA: Insufficient documentation

## 2015-01-01 DIAGNOSIS — Z720 Tobacco use: Secondary | ICD-10-CM | POA: Diagnosis not present

## 2015-01-01 NOTE — ED Notes (Signed)
Patient states she feels like the world is closing in on her.  Patient states she has been denied for a loan recently for a home and she states that "I lost all my savings".  Patient states has been going back and forth between living in car and staying with friends.   Patient states can't sleep.   Patient denies SI/HI.

## 2015-01-01 NOTE — ED Notes (Signed)
Called pt to room 2X, do not see pt in waiting room at this time.

## 2015-01-05 ENCOUNTER — Emergency Department (HOSPITAL_COMMUNITY)
Admission: EM | Admit: 2015-01-05 | Discharge: 2015-01-05 | Disposition: A | Discharge status: Home / Self Care | Payer: No Typology Code available for payment source | Attending: Emergency Medicine | Admitting: Emergency Medicine

## 2015-01-05 ENCOUNTER — Encounter (HOSPITAL_COMMUNITY): Payer: Self-pay | Admitting: Nurse Practitioner

## 2015-01-05 DIAGNOSIS — Y92410 Unspecified street and highway as the place of occurrence of the external cause: Secondary | ICD-10-CM | POA: Insufficient documentation

## 2015-01-05 DIAGNOSIS — Y9389 Activity, other specified: Secondary | ICD-10-CM | POA: Insufficient documentation

## 2015-01-05 DIAGNOSIS — Z8679 Personal history of other diseases of the circulatory system: Secondary | ICD-10-CM | POA: Insufficient documentation

## 2015-01-05 DIAGNOSIS — E669 Obesity, unspecified: Secondary | ICD-10-CM | POA: Diagnosis not present

## 2015-01-05 DIAGNOSIS — Y998 Other external cause status: Secondary | ICD-10-CM | POA: Diagnosis not present

## 2015-01-05 DIAGNOSIS — F17210 Nicotine dependence, cigarettes, uncomplicated: Secondary | ICD-10-CM | POA: Insufficient documentation

## 2015-01-05 DIAGNOSIS — S4991XA Unspecified injury of right shoulder and upper arm, initial encounter: Secondary | ICD-10-CM | POA: Insufficient documentation

## 2015-01-05 DIAGNOSIS — Z792 Long term (current) use of antibiotics: Secondary | ICD-10-CM | POA: Insufficient documentation

## 2015-01-05 DIAGNOSIS — S199XXA Unspecified injury of neck, initial encounter: Secondary | ICD-10-CM | POA: Insufficient documentation

## 2015-01-05 DIAGNOSIS — Z79899 Other long term (current) drug therapy: Secondary | ICD-10-CM | POA: Insufficient documentation

## 2015-01-05 HISTORY — DX: Migraine, unspecified, not intractable, without status migrainosus: G43.909

## 2015-01-05 MED ORDER — METHOCARBAMOL 500 MG PO TABS: 500.0000 mg | ORAL_TABLET | Freq: Two times a day (BID) | ORAL | Status: DC

## 2015-01-05 MED ORDER — IBUPROFEN 800 MG PO TABS
800.0000 mg | ORAL_TABLET | Freq: Three times a day (TID) | ORAL | Status: DC
Start: 2015-01-05 — End: 2017-08-15
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
## 2015-01-05 NOTE — Discharge Instructions (Signed)
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
## 2015-01-11 ENCOUNTER — Emergency Department (HOSPITAL_COMMUNITY)
Admission: EM | Admit: 2015-01-11 | Discharge: 2015-01-11 | Disposition: A | Discharge status: Home / Self Care | Payer: Medicaid Other | Attending: Emergency Medicine | Admitting: Emergency Medicine

## 2015-01-11 ENCOUNTER — Encounter (HOSPITAL_COMMUNITY): Payer: Self-pay | Admitting: Emergency Medicine

## 2015-01-11 DIAGNOSIS — E669 Obesity, unspecified: Secondary | ICD-10-CM | POA: Insufficient documentation

## 2015-01-11 DIAGNOSIS — F419 Anxiety disorder, unspecified: Secondary | ICD-10-CM | POA: Diagnosis not present

## 2015-01-11 DIAGNOSIS — Z590 Homelessness: Secondary | ICD-10-CM | POA: Diagnosis not present

## 2015-01-11 DIAGNOSIS — F17210 Nicotine dependence, cigarettes, uncomplicated: Secondary | ICD-10-CM | POA: Diagnosis not present

## 2015-01-11 DIAGNOSIS — Z792 Long term (current) use of antibiotics: Secondary | ICD-10-CM | POA: Diagnosis not present

## 2015-01-11 DIAGNOSIS — F329 Major depressive disorder, single episode, unspecified: Secondary | ICD-10-CM | POA: Insufficient documentation

## 2015-01-11 DIAGNOSIS — Z8679 Personal history of other diseases of the circulatory system: Secondary | ICD-10-CM | POA: Diagnosis not present

## 2015-01-11 DIAGNOSIS — F32A Depression, unspecified: Secondary | ICD-10-CM

## 2015-01-11 DIAGNOSIS — Z79899 Other long term (current) drug therapy: Secondary | ICD-10-CM | POA: Diagnosis not present

## 2015-01-11 MED ORDER — HYDROXYZINE HCL 25 MG PO TABS: 25.0000 mg | ORAL_TABLET | Freq: Four times a day (QID) | ORAL | Status: DC | PRN

## 2015-01-11 NOTE — ED Provider Notes (Signed)
CSN: [id number]     Arrival date & time 01/11/15  1012 History   First MD Initiated Contact with Patient 01/11/15 1022     Chief Complaint  Patient presents with  . Anxiety  . Depression     (Consider location/radiation/quality/duration/timing/severity/associated sxs/prior Treatment) HPI   44 year old female without any significant psychiatric history who presents for evaluation of depression and anxiety. Patient states she has been having bouts of anxiety and depression for the past 2 months after losing out on her mortgage and now living basically homeless. She has 2 young children and she worried for the well-being. She is currently staying with her daughter's grandmother's house. She is having crying spells having decreased appetite and having trouble sleeping. She denies SI/HI/visual or auditory hallucination. She denies self medicating either with alcohol or street drugs. She is here requesting for help with her anxiety. She does not take any medication regular basis. Her last menstrual period was 12/25/2014.  Past Medical History  Diagnosis Date  . Obesity   . Migraine    Past Surgical History  Procedure Laterality Date  . Cesarean section    . Cesarean section with bilateral tubal ligation Bilateral 05/17/2012    Procedure: CESAREAN SECTION WITH BILATERAL TUBAL LIGATION;  Surgeon: Mickel Baas, MD;  Location: [facility];  Service: Obstetrics;  Laterality: Bilateral;  . Cholecystectomy N/A 05/30/2014    Procedure: LAPAROSCOPIC CHOLECYSTECTOMY WITH INTRAOPERATIVE CHOLANGIOGRAM;  Surgeon: Ovidio Kin, MD;  Location: [facility] OR;  Service: General;  Laterality: N/A;   Family History  Problem Relation Age of Onset  . Alcohol abuse Neg Hx   . Arthritis Neg Hx   . Asthma Neg Hx   . Birth defects Neg Hx   . Cancer Neg Hx   . COPD Neg Hx   . Depression Neg Hx   . Diabetes Neg Hx   . Drug abuse Neg Hx   . Early death Neg Hx   . Hearing loss Neg Hx   . Heart disease Neg Hx   .  Hyperlipidemia Neg Hx   . Hypertension Neg Hx   . Kidney disease Neg Hx   . Learning disabilities Neg Hx   . Mental illness Neg Hx   . Mental retardation Neg Hx   . Miscarriages / Stillbirths Neg Hx   . Stroke Neg Hx   . Vision loss Neg Hx    Social History  Substance Use Topics  . Smoking status: Current Every Day Smoker -- 0.25 packs/day    Types: Cigarettes  . Smokeless tobacco: Never Used  . Alcohol Use: Yes   OB History    Gravida Para Term Preterm AB TAB SAB Ectopic Multiple Living   Review of Systems  All other systems reviewed and are negative.     Allergies  Review of patient's allergies indicates no known allergies.  Home Medications   Prior to Admission medications   Medication Sig Start Date End Date Taking? Authorizing Provider  cephALEXin (KEFLEX) 500 MG capsule Take 1 capsule (500 mg total) by mouth 4 (four) times daily. 06/11/14   Harle Battiest, NP  ibuprofen (ADVIL,MOTRIN) 800 MG tablet Take 1 tablet (800 mg total) by mouth 3 (three) times daily. 01/05/15   Fayrene Helper, PA-C  methocarbamol (ROBAXIN) 500 MG tablet Take 1 tablet (500 mg total) by mouth 2 (two) times daily. 01/05/15   Fayrene Helper, PA-C  traMADol (ULTRAM) 50 MG tablet  Take 1 tablet (50 mg total) by mouth every 6 (six) hours as needed. 06/11/14   Harle BattiestElizabeth Tysinger, NP   BP 142/81 mmHg  Pulse 88  Temp(Src) 98.1 F (36.7 C) (Oral)  Resp 18  SpO2 98%  LMP 12/25/2014 Physical Exam  Constitutional: She is oriented to person, place, and time. She appears well-developed and well-nourished. No distress.  Obese African-American female, tearful but nontoxic.  HENT:  Head: Atraumatic.  Eyes: Conjunctivae are normal.  Neck: Neck supple.  Cardiovascular: Normal rate and regular rhythm.   Pulmonary/Chest: Effort normal and breath sounds normal.  Abdominal: Soft. There is no tenderness.  Neurological: She is alert and oriented to person, place, and time. GCS eye subscore  is 4. GCS verbal subscore is 5. GCS motor subscore is 6.  Skin: No rash noted.  Psychiatric: Her speech is normal and behavior is normal. Judgment normal. Thought content is not paranoid. Cognition and memory are normal. She exhibits a depressed mood. She expresses no homicidal and no suicidal ideation.  Calm and cooperative, speech is goal oriented.  Nursing note and vitals reviewed.   ED Course  Procedures (including critical care time)  Patient here with complaints of depression and anxiety. She does have a reason to account for depression after losing her mortgage and is now dealing with in homeless. She does have family support.  She is requesting for help with her bouts of anxiety. Patient will be prescribed a short course of hydroxyzine for anxiety. Outpatient resources provided. Recommend patient to follow-up with a counselor or psychiatrist or to utilize resources to help her get back on her feet. Return precaution discussed.    MDM   Final diagnoses:  Anxiety and depression    BP 142/81 mmHg  Pulse 88  Temp(Src) 98.1 F (36.7 C) (Oral)  Resp 18  SpO2 98%  LMP 12/25/2014     Fayrene HelperBowie Brietta Manso, PA-C 01/11/15 1111  Alvira MondayErin Schlossman, MD 01/13/15 1928

## 2015-01-11 NOTE — ED Notes (Signed)
Pt to ER with complaint of depression and anxiety x1 month. Pt tearful at this time, sad. Pt denies SI/HI. Denies hallucinations. Denies any psych hx. A/O x4.

## 2015-01-11 NOTE — Discharge Instructions (Signed)
Please uses resources below to follow up for help with your anxiety and depression.  Take Vistaril as needed for anxiety.  Return to ER if you have any concerns.   Generalized Anxiety Disorder Generalized anxiety disorder (GAD) is a mental disorder. It interferes with life functions, including relationships, work, and school. GAD is different from normal anxiety, which everyone experiences at some point in their lives in response to specific life events and activities. Normal anxiety actually helps [place] prepare for and get through these life events and activities. Normal anxiety goes away after the event or activity is over.  GAD causes anxiety that is not necessarily related to specific events or activities. It also causes excess anxiety in proportion to specific events or activities. The anxiety associated with GAD is also difficult to control. GAD can vary from mild to severe. People with severe GAD can have intense waves of anxiety with physical symptoms (panic attacks).  SYMPTOMS The anxiety and worry associated with GAD are difficult to control. This anxiety and worry are related to many life events and activities and also occur more days than not for 6 months or longer. People with GAD also have three or more of the following symptoms (one or more in children):  Restlessness.   Fatigue.  Difficulty concentrating.   Irritability.  Muscle tension.  Difficulty sleeping or unsatisfying sleep. DIAGNOSIS GAD is diagnosed through an assessment by your health care provider. Your health care provider will ask you questions aboutyour mood,physical symptoms, and events in your life. Your health care provider may ask you about your medical history and use of alcohol or drugs, including prescription medicines. Your health care provider may also do a physical exam and blood tests. Certain medical conditions and the use of certain substances can cause symptoms similar to those associated with GAD.  Your health care provider may refer you to a mental health specialist for further evaluation. TREATMENT The following therapies are usually used to treat GAD:   Medication. Antidepressant medication usually is prescribed for long-term daily control. Antianxiety medicines may be added in severe cases, especially when panic attacks occur.   Talk therapy (psychotherapy). Certain types of talk therapy can be helpful in treating GAD by providing support, education, and guidance. A form of talk therapy called cognitive behavioral therapy can teach you healthy ways to think about and react to daily life events and activities.  Stress managementtechniques. These include yoga, meditation, and exercise and can be very helpful when they are practiced regularly. A mental health specialist can help determine which treatment is best for you. Some people see improvement with one therapy. However, other people require a combination of therapies.   This information is not intended to replace advice given to you by your health care provider. Make sure you discuss any questions you have with your health care provider.   Document Released: 06/06/2012 Document Revised: 03/02/2014 Document Reviewed: 06/06/2012 Elsevier Interactive Patient Education [employer]   Emergency Department Resource Guide 1) Find a Doctor and Pay Out of Pocket Although you won't have to find out who is covered by your insurance plan, it is a good idea to ask around and get recommendations. You will then need to call the office and see if the doctor you have chosen will accept you as a new patient and what types of options they offer for patients who are self-pay. Some doctors offer discounts or will set up payment plans for their patients who do not have insurance,  but you will need to ask so you aren't surprised when you get to your appointment.  2) Contact Your Local Health Department Not all health departments have doctors that  can see patients for sick visits, but many do, so it is worth a call to see if yours does. If you don't know where your local health department is, you can check in your phone book. The CDC also has a tool to help you locate your state's health department, and many state websites also have listings of all of their local health departments.  3) Find a Walk-in Clinic If your illness is not likely to be very severe or complicated, you may want to try a walk in clinic. These are popping up all over the country in pharmacies, drugstores, and shopping centers. They're usually staffed by nurse practitioners or physician assistants that have been trained to treat common illnesses and complaints. They're usually fairly quick and inexpensive. However, if you have serious medical issues or chronic medical problems, these are probably not your best option.  No Primary Care Doctor: - Call Health Connect at  [phone] - they can help you locate a primary care doctor that  accepts your insurance, provides certain services, etc. - Physician Referral Service- [phone]  Chronic Pain Problems: Organization         Address  Phone   Notes  [facility]  [phone] Patients need to be referred by their primary care doctor.   Medication Assistance: Organization         Address  Phone   Notes  [facility] Medication [facility] [address] [place] [place], [place] [address] [phone] --Must be a resident of [facility] -- Must have NO insurance coverage whatsoever (no Medicaid/ Medicare, etc.) -- The pt. MUST have a primary care doctor that directs their care regularly and follows them in the community   Med[facility]ist  [phone]   [employer]  [phone]    Agencies that provide inexpensive medical care: Organization         Address  Phone   Notes  [facility]  [phone]   [facility]    [phone]   [facility] [address] [place], [place] [address] [phone]   [facility] [address] [place] [place] [phone]   Planned Parenthood    [phone]   [facility]    [phone]   [facility]  [address], [place] Phone:  [phone], Fax:  [phone] Hours of Operation:  9 am - 6 pm, M-F.  Also accepts Medicaid/Medicare and self-pay.  [facility]  [address], [place] Phone: [phone], Fax: [phone]. Hours of Operation:  8:30 am - 5:30 pm, M-F.  Also accepts Medicaid and self-pay.  [facility] [place] [address], [place] Phone: [phone]   [facility] [address] Natasha Bence [place], [place] [phone], Ext. [phone] Mondays & Thursdays: 7-9 AM.  First 15 patients are seen on a first come, first serve basis.    Medicaid-accepting [facility] Providers:  Organization         Address  Phone   Notes  [facility] [address], [place] [phone] Also accepts self-pay patients.  [facility] [address] [place],  [address] [place]  [phone]   [facility] [address], [place] [phone]   [facility] [address], [place] [phone]   Renaye Rakers [address], [place]   [phone] Only accepts [place] patients after they have their name applied to their card.   Self-Pay (no insurance) in [facility]:  Organization         Address  Phone   Notes  Sickle Cell Patients, [facility] [address] [place], [place] [phone]   [facility] [address] [place], [place] [phone]   [facility]  [address], [place] [phone]   [facility]/Dr.  Osei-Bonsu  [address], [place] or [address], [place] [phone] Phone number for both [place] and [place] locations is the same.  [facility] [address] [place] [address], [place] [phone]   [facility] [address], [place] or [address] [phone]   [facility] [address], [place] [phone], phone; [phone], fax Sees patients 1st and 3rd Saturday of every month.  Must not qualify for public or private insurance (i.e. Medicaid, Medicare, [place] Health Choice, Veterans' Benefits)  Household income should be no more than 200% of the poverty level The clinic cannot treat you if you are pregnant or think you are pregnant  Sexually transmitted diseases are not treated at the clinic.    Dental Care: Organization         Address  Phone  Notes  [facility] Department of [facility] [address] [place], [place] [phone] Accepts children up to age 1 who are enrolled in [place] or [place] Health Choice; pregnant women with a Medicaid card; and children who have applied for Medicaid or [place] Health Choice, but were declined, whose parents can pay a reduced fee at time of service.  [facility] of [facility]  [address], [place] [phone] Accepts children up to age 38 who are enrolled in [place] or [place] Health Choice; pregnant women with a Medicaid card; and children who have applied for Medicaid or [place] Health Choice, but were declined, whose parents can pay a reduced fee at time of service.  [facility]  [address] [place], [place] [phone] Patients are seen by appointment only. Walk-ins are not accepted. [facility] will see patients 30 years of age and older. Monday - Tuesday (8am-5pm) Most Wednesdays (8:30-5pm) $30 per visit, cash only  [facility] Adult  Dental Access PROGRAM  [address], [facility] [phone] Patients are seen by appointment only. Walk-ins are not accepted. [facility] will see patients 41 years of age and older. One Wednesday Evening (Monthly: Volunteer Based).  $30 per visit, cash only  [employer]  [phone] for adults; Children under age 27, call Graduate Pediatric Dentistry at [phone]. Children aged 50-14, please call [phone] to request a pediatric application.  Dental services are provided in all areas of dental care including fillings, crowns and bridges, complete and partial dentures, implants, gum treatment, root canals, and extractions. Preventive care is also provided. Treatment is provided to both adults and children. Patients are selected via a  lottery and there is often a waiting list.   [facility] [address], [place]  [phone] [email or web address]   [facility] [address], [place], [place] [phone], Ext. [phone] Second and Fourth Thursday of each month, opens at 6:30 AM; Clinic ends at 9 AM.  Patients are seen on a first-come first-served basis, and a limited number are seen during each clinic.   [facility]  [address], [place], [place] [phone]   Eligibility Requirements You must have lived in [place], [place], or [place] counties for at least the last three months.   You cannot be eligible for state or federal sponsored [place]healthcare insurance, including CIGNAVeterans Administration, [place]icaid, or [employer]icare.   You generally cannot be eligible for healthcare insurance through your employer.    How to apply: Eligibility screenings are held every Tuesday and Wednesday afternoon from 1:00 pm until 4:00 pm. You do not need an appointment for the interview!  [facility] [address], [place], [place] [phone]   [facility]  [phone]   [facility]  [phone]   [facility]  [phone]    Behavioral Health Resources in the Community: Intensive Outpatient Programs Organization         Address  Phone  Notes  [facility] [address], [place], [place] [phone]   [facility] Outpatient [address], [place], [place] [phone]   [facility] [address], [place], [place]  [phone]   [facility] [address],  [place], [place] [phone] or [phone]   Substance Abuse Resources Organization         Address  Phone  Notes  [facility]  [phone]   [facility]  [phone]   [facility]  [phone]   [facility]  [phone]   Residential & Outpatient Substance Abuse Program  [phone]   Psychological Services Organization         Address  Phone  Notes  [facility]  [phone]- [phone]   [facility]  [phone]- [phone]   [facility] [address], [place] [phone] or [phone]    Mobile Crisis Teams Organization         Address  Phone  Notes  [facility], Mobile Crisis Care Unit  [phone]   [facility]  [address] [place], [place] [phone]   Doristine LocksSharon DeEsch [address] [place] [phone]    Self-Help/Support Groups Organization         Address  Phone             Notes  [facility] [place] - variety of support groups  [phone] Call for more information  Narcotics Anonymous (NA), [facility] [address], [place]  2 meetings at this location   Statisticianesidential Treatment Programs Organization         Address  Phone  Notes  [facility] [address],    [place]  [phone]   [facility]  [address], [place] [phone], [place], [place] [phone]   [facility]  [facility]ility [address], [place] [phone] Admissions: 8am-3pm M-F  [facility] [address]    [place], [place] [phone]   [facility] [address],  [place], [place]   [facility] [address],  [place], [place]   [facility] - Intensive Outpatient [address], [place], [place]   [facility] [place].,  [place], [place] [phone] or [phone]   [facility] ([facility]) [address] [place], [place] Accepts Medicaid  [facility] [address],  [place] [phone] Substance Abuse/Addiction Treatment   [facility] Organization         Address  Phone  Notes  [facility]  [phone]   Domenic Schwab, PhD [address] Arlis Porta [place], [place]   [phone] or [phone]   Lyman Niederwald [place], [place] [phone]   [facility] [address], [place], [place] [phone] Insurance/Medicaid/sponsorship through [facility] Families [address]                                    [place], [place] [phone] [place] [address], [place] [phone]    Dr. Adele Schilder  [phone]   [facility] Dept. 1) [address] [place] 2) [place] 3)  [address], [place] [phone]  [phone]   [place] [phone] or [phone] (After Hours)

## 2015-01-11 NOTE — ED Notes (Signed)
Pt verbalizes understanding of discharge instructions. NAD on departure. VSS.

## 2015-08-01 ENCOUNTER — Other Ambulatory Visit: Payer: Self-pay | Admitting: Obstetrics & Gynecology

## 2015-08-01 DIAGNOSIS — R928 Other abnormal and inconclusive findings on diagnostic imaging of breast: Secondary | ICD-10-CM

## 2015-08-09 ENCOUNTER — Other Ambulatory Visit: Payer: Medicaid Other

## 2015-08-28 ENCOUNTER — Other Ambulatory Visit: Payer: Medicaid Other

## 2015-09-23 ENCOUNTER — Other Ambulatory Visit: Payer: Medicaid Other

## 2015-11-27 ENCOUNTER — Encounter: Payer: Self-pay | Admitting: Podiatry

## 2015-11-27 ENCOUNTER — Ambulatory Visit (INDEPENDENT_AMBULATORY_CARE_PROVIDER_SITE_OTHER): Payer: Medicaid Other | Admitting: Podiatry

## 2015-11-27 VITALS — BP 134/79 | HR 71 | Resp 14

## 2015-11-27 DIAGNOSIS — M79676 Pain in unspecified toe(s): Secondary | ICD-10-CM | POA: Diagnosis not present

## 2015-11-27 DIAGNOSIS — B351 Tinea unguium: Secondary | ICD-10-CM | POA: Diagnosis not present

## 2015-11-27 NOTE — Progress Notes (Signed)
   Subjective:    Patient ID: Madeline Silva, female    DOB: September 24, 1970, 45 y.o.   MRN: [id number]  HPI this patient presents to the office with chief complaint of thick, great toenails on both feet that occasionally fall off on their own. She says she had a very bad episode of athlete's foot as a child, which was treated with powders over the last 1-1/2 years. Her great toenails have fallen off and regrown. She denies any history of trauma or injury to the foot. She presents the office for an evaluation of these nails    Review of Systems  All other systems reviewed and are negative.      Objective:   Physical Exam GENERAL APPEARANCE: Alert, conversant. Appropriately groomed. No acute distress.  VASCULAR: Pedal pulses are  palpable at  [facility] and PT bilateral.  Capillary refill time is immediate to all digits,  Normal temperature gradient.  Digital hair growth is present bilateral  NEUROLOGIC: sensation is normal to 5.07 monofilament at 5/5 sites bilateral.  Light touch is intact bilateral, Muscle strength normal.  MUSCULOSKELETAL: acceptable muscle strength, tone and stability bilateral.  Intrinsic muscluature intact bilateral.  Rectus appearance of foot and digits noted bilateral.   DERMATOLOGIC: skin color, texture, and turgor are within normal limits.  No preulcerative lesions or ulcers  are seen, no interdigital maceration noted.  No open lesions present.   No drainage noted.  NAILS  thick disfigured discolored great toenail, right toe, which is unattached from the nailbed. Patient has thick disfigured discolored left great toenail which is attached  to to the nailbed. No evidence of any redness or swelling or drainage noted from these nails         Assessment & Plan:  Onychomycosis  B/L    IE  Sample of right hallux toenail was taken and is to be sent to the lab. We will determine whether the thickness is related to the fungus and then treated appropriately. Return to the clinic in 4  weeks for evaluation and treatment   Madeline Silva DPM

## 2015-11-28 ENCOUNTER — Ambulatory Visit: Payer: Medicaid Other | Admitting: Podiatry

## 2015-12-25 ENCOUNTER — Ambulatory Visit: Payer: Medicaid Other | Admitting: Podiatry

## 2016-02-06 ENCOUNTER — Telehealth: Payer: Self-pay | Admitting: *Deleted

## 2016-02-06 NOTE — Telephone Encounter (Addendum)
Pt left name and phone number. Pt states she is calling for lab results. I told pt the results had arrived and I would fax to Dr. Stacie AcresMayer and call again with instructions. 02/07/2016-DrStacie Acres. Mayer reviewed 11/27/2015 fungal culture results and states pt needs an appt to discuss results and tratmetn.

## 2016-02-12 ENCOUNTER — Ambulatory Visit: Payer: Medicaid Other | Admitting: Podiatry

## 2017-08-15 ENCOUNTER — Emergency Department (HOSPITAL_COMMUNITY)
Admission: EM | Admit: 2017-08-15 | Discharge: 2017-08-15 | Disposition: A | Discharge status: Home / Self Care | Payer: No Typology Code available for payment source | Attending: Emergency Medicine | Admitting: Emergency Medicine

## 2017-08-15 ENCOUNTER — Emergency Department (HOSPITAL_COMMUNITY): Payer: No Typology Code available for payment source

## 2017-08-15 ENCOUNTER — Encounter (HOSPITAL_COMMUNITY): Payer: Self-pay

## 2017-08-15 DIAGNOSIS — Y998 Other external cause status: Secondary | ICD-10-CM | POA: Insufficient documentation

## 2017-08-15 DIAGNOSIS — F17210 Nicotine dependence, cigarettes, uncomplicated: Secondary | ICD-10-CM | POA: Insufficient documentation

## 2017-08-15 DIAGNOSIS — S161XXA Strain of muscle, fascia and tendon at neck level, initial encounter: Secondary | ICD-10-CM | POA: Diagnosis not present

## 2017-08-15 DIAGNOSIS — Y939 Activity, unspecified: Secondary | ICD-10-CM | POA: Diagnosis not present

## 2017-08-15 DIAGNOSIS — Y92410 Unspecified street and highway as the place of occurrence of the external cause: Secondary | ICD-10-CM | POA: Diagnosis not present

## 2017-08-15 DIAGNOSIS — S199XXA Unspecified injury of neck, initial encounter: Secondary | ICD-10-CM | POA: Diagnosis present

## 2017-08-15 MED ORDER — IBUPROFEN 800 MG PO TABS
800.0000 mg | ORAL_TABLET | Freq: Once | ORAL | Status: AC
  Administered 2017-08-15: 800 mg via ORAL
  Filled 2017-08-15: qty 1

## 2017-08-15 MED ORDER — IBUPROFEN 800 MG PO TABS: 800.0000 mg | ORAL_TABLET | Freq: Three times a day (TID) | ORAL | 0 refills | Status: DC | PRN

## 2017-08-15 MED ORDER — CYCLOBENZAPRINE HCL 10 MG PO TABS: 10.0000 mg | ORAL_TABLET | Freq: Every day | ORAL | 0 refills | Status: DC

## 2017-08-15 MED ORDER — TRAMADOL HCL 50 MG PO TABS: 50.0000 mg | ORAL_TABLET | Freq: Four times a day (QID) | ORAL | 0 refills | Status: DC | PRN
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
## 2017-08-15 NOTE — Discharge Instructions (Addendum)
Return here as needed.  Follow-up with your doctor.  There is no abnormalities noted on the x-rays.

## 2018-01-13 ENCOUNTER — Other Ambulatory Visit: Payer: Self-pay

## 2018-01-13 ENCOUNTER — Emergency Department (HOSPITAL_COMMUNITY)
Admission: EM | Admit: 2018-01-13 | Discharge: 2018-01-13 | Disposition: A | Discharge status: Home / Self Care | Payer: Medicaid Other | Attending: Emergency Medicine | Admitting: Emergency Medicine

## 2018-01-13 DIAGNOSIS — L02215 Cutaneous abscess of perineum: Secondary | ICD-10-CM | POA: Insufficient documentation

## 2018-01-13 DIAGNOSIS — F17210 Nicotine dependence, cigarettes, uncomplicated: Secondary | ICD-10-CM | POA: Insufficient documentation

## 2018-01-13 DIAGNOSIS — L02214 Cutaneous abscess of groin: Secondary | ICD-10-CM | POA: Diagnosis present

## 2018-01-13 MED ORDER — LIDOCAINE-EPINEPHRINE (PF) 2 %-1:200000 IJ SOLN
10.0000 mL | Freq: Once | INTRAMUSCULAR | Status: AC
  Administered 2018-01-13: 10 mL
  Filled 2018-01-13: qty 10

## 2018-01-13 NOTE — ED Notes (Signed)
Pt st's abscess started approx 1 week ago.  No drainage

## 2018-01-13 NOTE — Discharge Instructions (Signed)
Continue to soak the area with warm compresses or in warm water.  Remove the packing in 2 days if it is still present

## 2018-01-13 NOTE — ED Provider Notes (Signed)
[facility] EMERGENCY DEPARTMENT Provider Note   CSN: [id number] Arrival date & time: 01/13/18  1744     History   Chief Complaint Chief Complaint  Patient presents with  . Abscess    HPI Madeline Silva is a 47 y.o. female.  The history is provided by the patient.  Abscess  Location:  Pelvis Pelvic abscess location:  Perineum Size:  1x1cm Abscess quality: fluctuance, induration and painful   Red streaking: no   Duration:  2 weeks Progression:  Worsening Pain details:    Quality:  Tightness, sharp and shooting   Severity:  Moderate   Duration:  2 weeks   Timing:  Constant   Progression:  Worsening Chronicity:  Recurrent Context: not diabetes and not immunosuppression   Context comment:  Shaves in that area Relieved by:  Nothing Worsened by:  Nothing Ineffective treatments:  Draining/squeezing, warm compresses and warm water soaks Associated symptoms: no fever, no headaches, no nausea and no vomiting   Risk factors: prior abscess     Past Medical History:  Diagnosis Date  . Migraine   . Obesity     Patient Active Problem List   Diagnosis Date Noted  . 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 CESAREAN SECTION    . CESAREAN SECTION WITH BILATERAL TUBAL LIGATION Bilateral 05/17/2012   Procedure: CESAREAN SECTION WITH BILATERAL TUBAL LIGATION;  Surgeon: Mickel Baas, MD;  Location: [facility];  Service: Obstetrics;  Laterality: Bilateral;  . CHOLECYSTECTOMY N/A 05/30/2014   Procedure: LAPAROSCOPIC CHOLECYSTECTOMY WITH INTRAOPERATIVE CHOLANGIOGRAM;  Surgeon: Ovidio Kin, MD;  Location: [facility] OR;  Service: General;  Laterality: N/A;     OB History    Gravida  4   Para  2   Term  1   Preterm  1   AB  2   Living  2     SAB  1   TAB  1   Ectopic      Multiple      Live Births  2            Home Medications    Prior to Admission medications   Medication Sig Start Date  End Date Taking? Authorizing Provider  cephALEXin (KEFLEX) 500 MG capsule Take 1 capsule (500 mg total) by mouth 4 (four) times daily. Patient not taking: Reported on 11/27/2015 06/11/14   Harle Battiest, NP  cyclobenzaprine (FLEXERIL) 10 MG tablet Take 1 tablet (10 mg total) by mouth at bedtime. 08/15/17   Lawyer, Cristal Deer, PA-C  hydrOXYzine (ATARAX/VISTARIL) 25 MG tablet Take 1 tablet (25 mg total) by mouth every 6 (six) hours as needed for anxiety. Patient not taking: Reported on 11/27/2015 01/11/15   Fayrene Helper, PA-C  ibuprofen (ADVIL,MOTRIN) 800 MG tablet Take 1 tablet (800 mg total) by mouth every 8 (eight) hours as needed. 08/15/17   Lawyer, Cristal Deer, PA-C  methocarbamol (ROBAXIN) 500 MG tablet Take 1 tablet (500 mg total) by mouth 2 (two) times daily. Patient not taking: Reported on 11/27/2015 01/05/15   Fayrene Helper, PA-C  traMADol (ULTRAM) 50 MG tablet Take 1 tablet (50 mg total) by mouth every 6 (six) hours as needed for severe pain. 08/15/17   Charlestine Night, PA-C    Family History Family History  Problem Relation Age of Onset  . Alcohol abuse Neg Hx   . Arthritis Neg Hx   . Asthma Neg Hx   . Birth defects Neg Hx   .  Cancer Neg Hx   . COPD Neg Hx   . Depression Neg Hx   . Diabetes Neg Hx   . Drug abuse Neg Hx   . Early death Neg Hx   . Hearing loss Neg Hx   . Heart disease Neg Hx   . Hyperlipidemia Neg Hx   . Hypertension Neg Hx   . Kidney disease Neg Hx   . Learning disabilities Neg Hx   . Mental illness Neg Hx   . Mental retardation Neg Hx   . Miscarriages / Stillbirths Neg Hx   . Stroke Neg Hx   . Vision loss Neg Hx     Social History Social History   Tobacco Use  . Smoking status: Current Every Day Smoker    Packs/day: 0.25    Types: Cigarettes  . Smokeless tobacco: Never Used  Substance Use Topics  . Alcohol use: Yes  . Drug use: No     Allergies   Patient has no known allergies.   Review of Systems Review of Systems  Constitutional:  Negative for fever.  Gastrointestinal: Negative for nausea and vomiting.  Neurological: Negative for headaches.  All other systems reviewed and are negative.    Physical Exam Updated Vital Signs BP (!) 133/91   Pulse 82   Temp 98.5 F (36.9 C) (Oral)   Resp 16   Ht 5\' 5"  (1.651 m)   Wt 108.9 kg   LMP 12/12/2017   SpO2 100%   BMI 39.94 kg/m   Physical Exam  Constitutional: She is oriented to person, place, and time. She appears well-developed and well-nourished. No distress.  HENT:  Head: Normocephalic and atraumatic.  Eyes: Pupils are equal, round, and reactive to light. EOM are normal.  Cardiovascular: Normal rate.  Pulmonary/Chest: Effort normal.  Genitourinary:     Neurological: She is alert and oriented to person, place, and time.  Skin: Skin is warm and dry.  Psychiatric: She has a normal mood and affect. Her behavior is normal.  Nursing note and vitals reviewed.    ED Treatments / Results  Labs (all labs ordered are listed, but only abnormal results are displayed) Labs Reviewed - No data to display  EKG None  Radiology No results found.  Procedures Procedures (including critical care time) INCISION AND DRAINAGE Performed by: Gwyneth SproutWhitney Elven Laboy Consent: Verbal consent obtained. Risks and benefits: risks, benefits and alternatives were discussed Type: abscess  Body area: left perineal area  Anesthesia: local infiltration  Incision was made with a scalpel.  Local anesthetic: lidocaine 2% with epinephrine  Anesthetic total: 4 ml  Complexity: complex Blunt dissection to break up loculations  Drainage: purulent  Drainage amount: 2mL  Packing material: 1/4 in iodoform gauze  Patient tolerance: Patient tolerated the procedure well with no immediate complications.     Medications Ordered in ED Medications  lidocaine-EPINEPHrine (XYLOCAINE W/EPI) 2 %-1:200000 (PF) injection 10 mL (10 mLs Infiltration Given 01/13/18 1806)     Initial  Impression / Assessment and Plan / ED Course  I have reviewed the triage vital signs and the nursing notes.  Pertinent labs & imaging results that were available during my care of the patient were reviewed by me and considered in my medical decision making (see chart for details).     Patient with an uncomplicated abscess in her perineum.  I&D as above.  No antibiotics required at this time as there is no surrounding cellulitis.  Final Clinical Impressions(s) / ED Diagnoses   Final diagnoses:  Perineal abscess    ED Discharge Orders    None       Gwyneth Sprout, MD 01/13/18 1824
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
## 2020-08-09 ENCOUNTER — Other Ambulatory Visit: Payer: Self-pay | Admitting: Medical

## 2020-08-09 DIAGNOSIS — Z01818 Encounter for other preprocedural examination: Secondary | ICD-10-CM

## 2020-08-14 ENCOUNTER — Ambulatory Visit
Admission: RE | Admit: 2020-08-14 | Discharge: 2020-08-14 | Disposition: A | Discharge status: Home / Self Care | Payer: Medicare Other | Source: Ambulatory Visit | Attending: *Deleted | Admitting: *Deleted

## 2020-08-14 ENCOUNTER — Other Ambulatory Visit: Payer: Self-pay

## 2020-08-14 ENCOUNTER — Other Ambulatory Visit: Payer: Self-pay | Admitting: Medical

## 2020-08-14 DIAGNOSIS — Z01818 Encounter for other preprocedural examination: Secondary | ICD-10-CM

## 2021-08-15 IMAGING — US US ABDOMEN LIMITED
1 series · 12 of 12 positions shown · non-contrast
Comparison: CT of the abdomen and pelvis on 06/11/2014

CLINICAL DATA: Preop for liposuction. Verified no inguinal hernias
or umbilical hernias. Patient reports no bulging, pain, or abdominal
mass.

EXAM:
ULTRASOUND ABDOMEN LIMITED

[Series 1: us abdomen limited · 12 acquisitions, 12 frames shown]
[im 1/12]
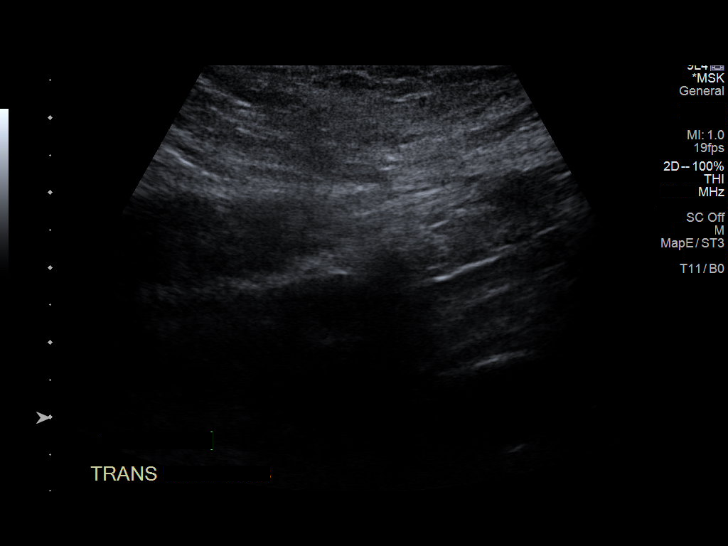
[im 2/12]
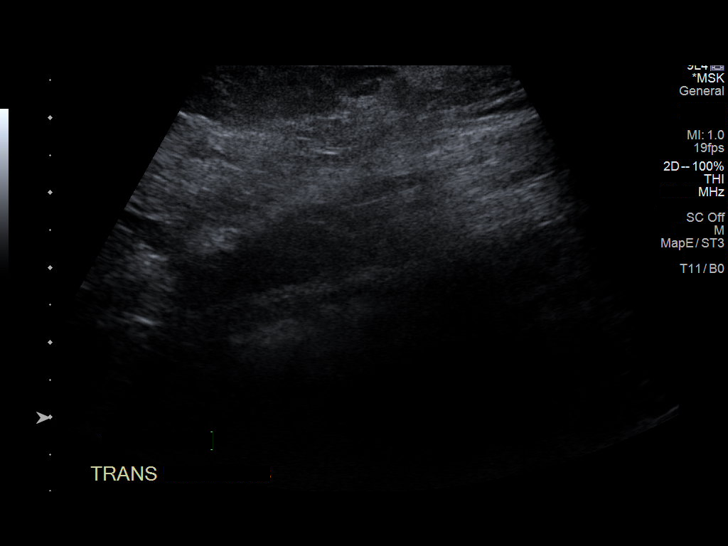
[im 3/12]
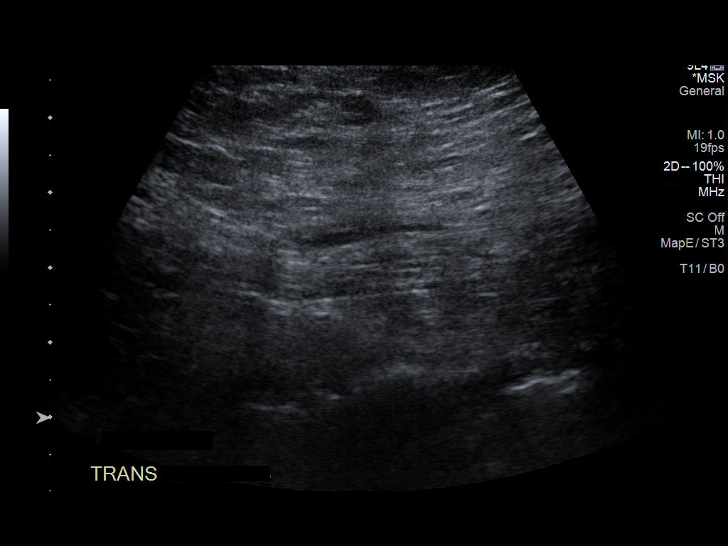
[im 4/12]
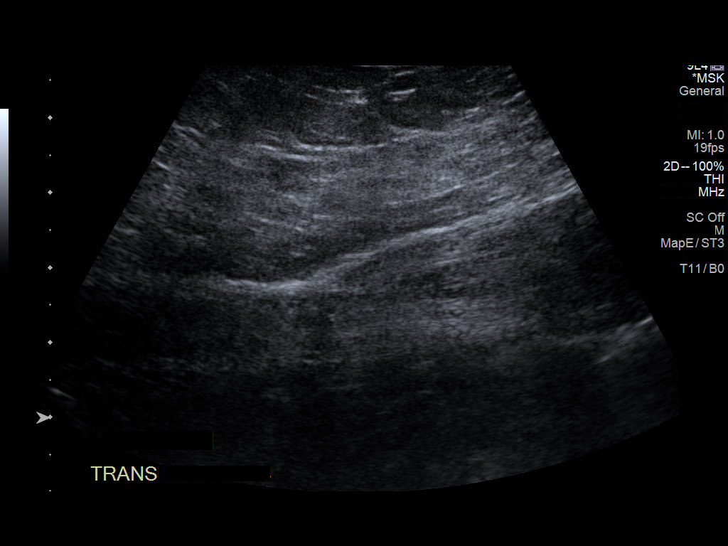
[im 5/12]
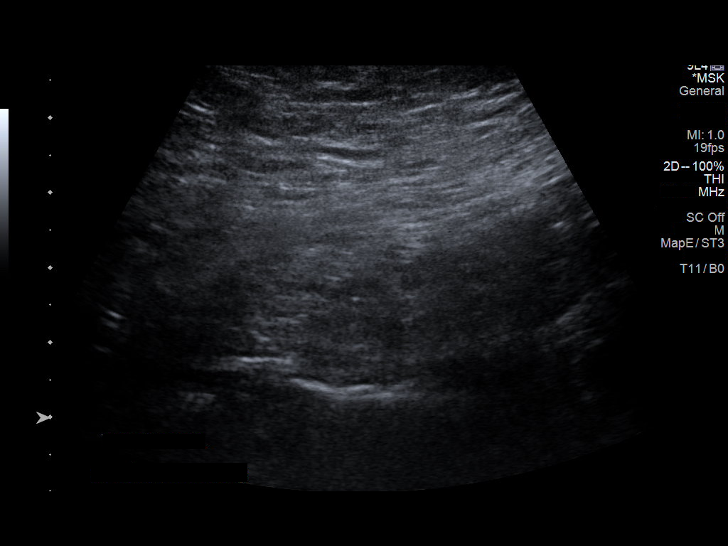
[im 6/12]
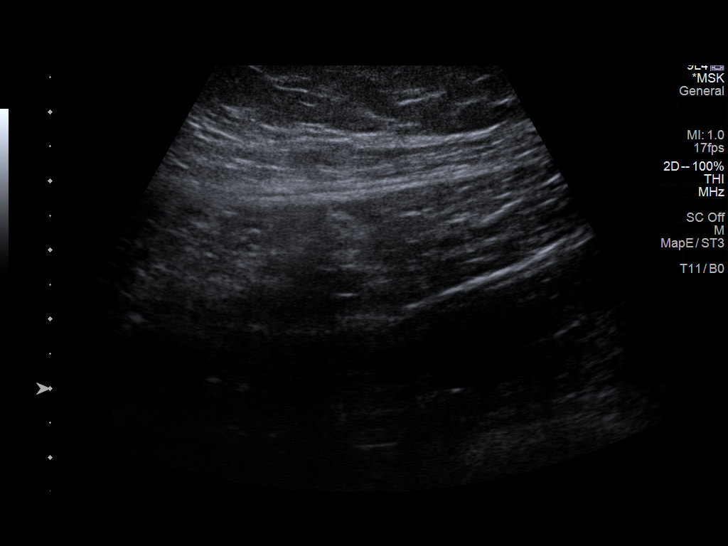
[im 7/12]
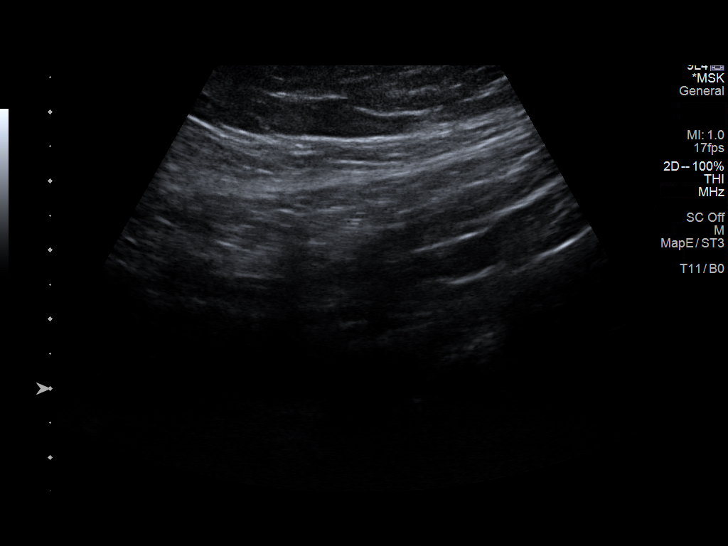
[im 8/12]
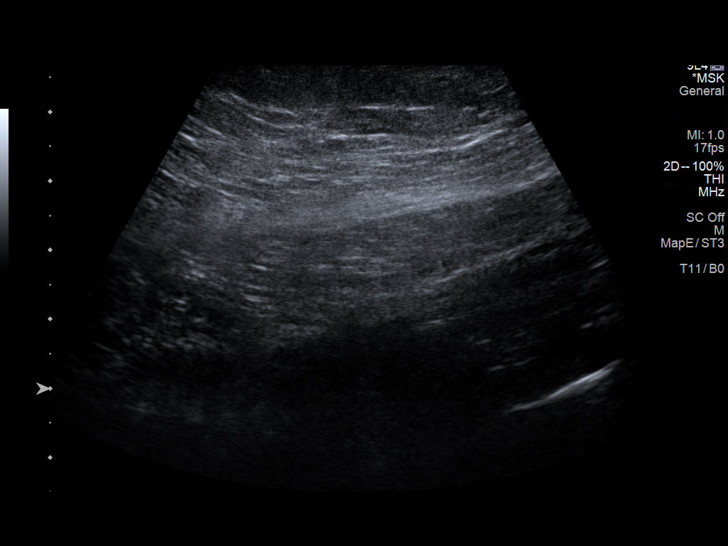
[im 9/12]
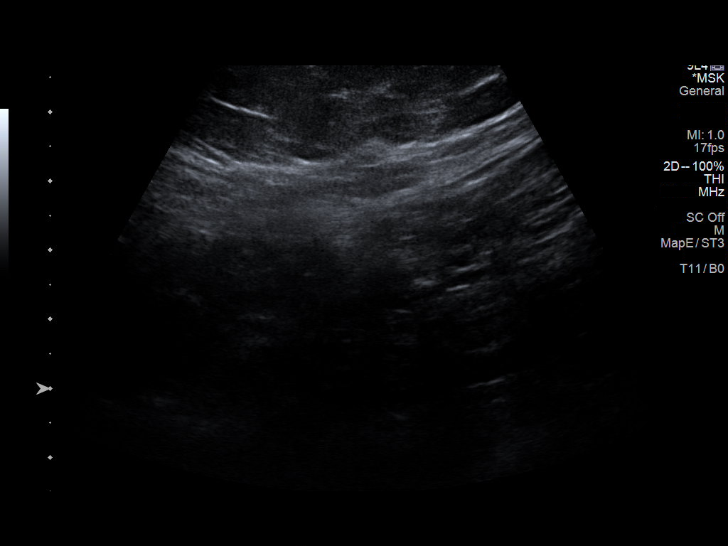
[im 10/12]
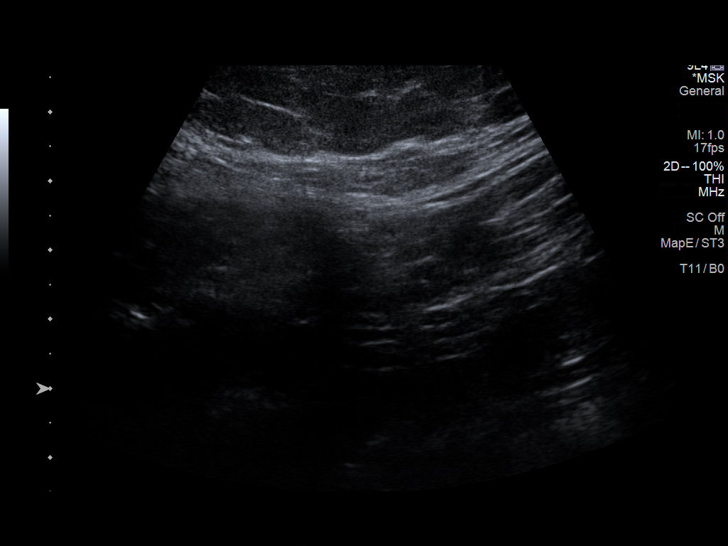
[im 11/12]
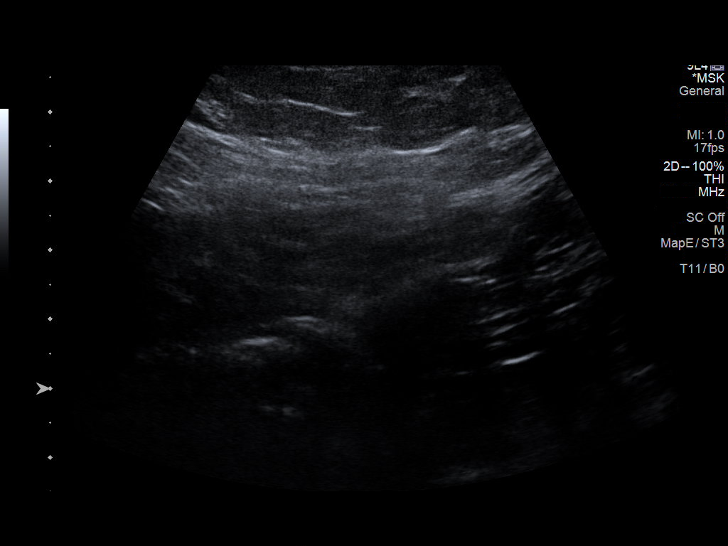
[im 12/12]
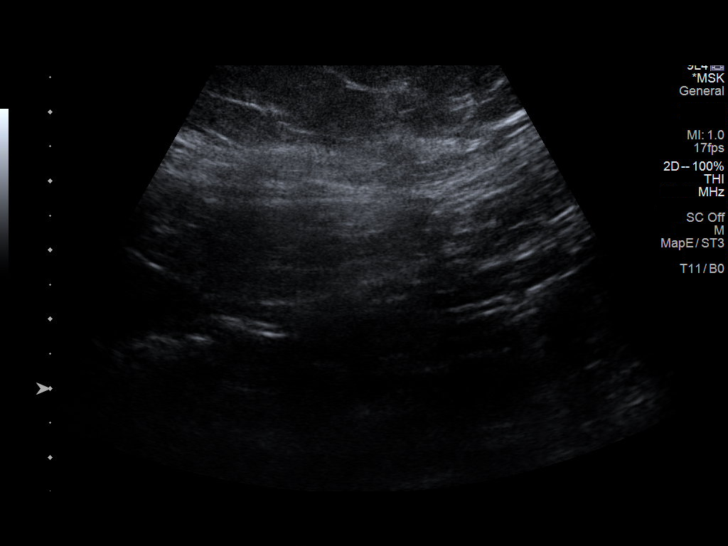

[12 of 12 positions shown; findings below may reference images not displayed]

FINDINGS: Targeted imaging is performed of the RIGHT and LEFT inguinal regions
and the periumbilical region. No mass or hernia identified.
IMPRESSION: The study is negative for inguinal or umbilical hernia.

## 2023-08-08 ENCOUNTER — Other Ambulatory Visit: Payer: Self-pay

## 2023-08-08 ENCOUNTER — Encounter (HOSPITAL_COMMUNITY): Payer: Self-pay

## 2023-08-08 ENCOUNTER — Emergency Department (HOSPITAL_COMMUNITY)
Admission: EM | Admit: 2023-08-08 | Discharge: 2023-08-08 | Disposition: A | Discharge status: Home / Self Care | Payer: Self-pay | Attending: Emergency Medicine | Admitting: Emergency Medicine

## 2023-08-08 DIAGNOSIS — M549 Dorsalgia, unspecified: Secondary | ICD-10-CM | POA: Insufficient documentation

## 2023-08-08 DIAGNOSIS — G8929 Other chronic pain: Secondary | ICD-10-CM | POA: Insufficient documentation

## 2023-08-08 LAB — BASIC METABOLIC PANEL WITH GFR
Anion gap: 8 (ref 5–15)
BUN: 9 mg/dL (ref 6–20)
CO2: 23 mmol/L (ref 22–32)
Calcium: 8.6 mg/dL — ABNORMAL LOW (ref 8.9–10.3)
Chloride: 108 mmol/L (ref 98–111)
Creatinine, Ser: 0.76 mg/dL (ref 0.44–1.00)
GFR, Estimated: >60 mL/min (ref >60)
Glucose, Bld: 81 mg/dL (ref 70–99)
Potassium: 3.9 mmol/L (ref 3.5–5.1)
Sodium: 139 mmol/L (ref 135–145)

## 2023-08-08 LAB — CBC WITH DIFFERENTIAL/PLATELET
Abs Immature Granulocytes: 0.01 10*3/uL (ref 0.00–0.07)
Basophils Absolute: 0 10*3/uL (ref 0.0–0.1)
Basophils Relative: 1 %
Eosinophils Absolute: 0.3 10*3/uL (ref 0.0–0.5)
Eosinophils Relative: 5 %
HCT: 37.5 % (ref 36.0–46.0)
Hemoglobin: 12.2 g/dL (ref 12.0–15.0)
Immature Granulocytes: 0 %
Lymphocytes Relative: 35 %
Lymphs Abs: 1.8 10*3/uL (ref 0.7–4.0)
MCH: 34.3 pg — ABNORMAL HIGH (ref 26.0–34.0)
MCHC: 32.5 g/dL (ref 30.0–36.0)
MCV: 105.3 fL — ABNORMAL HIGH (ref 80.0–100.0)
Monocytes Absolute: 0.5 10*3/uL (ref 0.1–1.0)
Monocytes Relative: 9 %
Neutro Abs: 2.7 10*3/uL (ref 1.7–7.7)
Neutrophils Relative %: 50 %
Platelets: 213 10*3/uL (ref 150–400)
RBC: 3.56 MIL/uL — ABNORMAL LOW (ref 3.87–5.11)
RDW: 13.7 % (ref 11.5–15.5)
WBC: 5.3 10*3/uL (ref 4.0–10.5)
nRBC: 0 % (ref 0.0–0.2)

## 2023-08-08 LAB — CBG MONITORING, ED: Glucose-Capillary: 69 mg/dL — ABNORMAL LOW (ref 70–99)

## 2023-08-08 LAB — MAGNESIUM: Magnesium: 1.9 mg/dL (ref 1.7–2.4)

## 2023-08-08 MED ORDER — NAPROXEN 500 MG PO TABS: 500.0000 mg | ORAL_TABLET | Freq: Two times a day (BID) | ORAL | 2 refills | Status: AC

## 2023-08-08 MED ORDER — OXYCODONE-ACETAMINOPHEN 5-325 MG PO TABS
1.0000 | ORAL_TABLET | Freq: Once | ORAL | Status: AC
  Administered 2023-08-08: 1 via ORAL
  Filled 2023-08-08: qty 1

## 2023-08-08 MED ORDER — METHOCARBAMOL 500 MG PO TABS: 500.0000 mg | ORAL_TABLET | Freq: Four times a day (QID) | ORAL | 0 refills | Status: AC

## 2023-08-08 MED ORDER — HYDROCODONE-ACETAMINOPHEN 5-325 MG PO TABS: 1.0000 | ORAL_TABLET | ORAL | 0 refills | Status: AC | PRN

## 2023-08-08 NOTE — ED Notes (Signed)
 Drury Geralds, MD aware of CBG, pt provided OJ and a snack
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
## 2023-08-08 NOTE — Discharge Instructions (Signed)
 Schedule evaluation with Orthopaedist.
